# Patient Record
Sex: Female | Born: 1950
Health system: Southern US, Community
[De-identification: ages and names within clinical notes are randomized; demographics above are authoritative.]

## PROBLEM LIST (undated history)

## (undated) DIAGNOSIS — T8859XA Other complications of anesthesia, initial encounter: Secondary | ICD-10-CM

## (undated) DIAGNOSIS — Z9889 Other specified postprocedural states: Secondary | ICD-10-CM

## (undated) DIAGNOSIS — T4145XA Adverse effect of unspecified anesthetic, initial encounter: Secondary | ICD-10-CM

## (undated) DIAGNOSIS — R112 Nausea with vomiting, unspecified: Secondary | ICD-10-CM

## (undated) HISTORY — PX: BREAST CYST ASPIRATION: SHX578

---

## 1998-01-04 ENCOUNTER — Other Ambulatory Visit: Admission: RE | Admit: 1998-01-04 | Discharge: 1998-01-04 | Payer: Self-pay | Admitting: Obstetrics & Gynecology

## 1998-10-26 ENCOUNTER — Other Ambulatory Visit: Admission: RE | Admit: 1998-10-26 | Discharge: 1998-10-26 | Payer: Self-pay | Admitting: Obstetrics & Gynecology

## 1999-01-05 ENCOUNTER — Other Ambulatory Visit: Admission: RE | Admit: 1999-01-05 | Discharge: 1999-01-05 | Payer: Self-pay | Admitting: Obstetrics & Gynecology

## 1999-11-09 ENCOUNTER — Encounter (INDEPENDENT_AMBULATORY_CARE_PROVIDER_SITE_OTHER): Payer: Self-pay | Admitting: Specialist

## 1999-11-09 ENCOUNTER — Inpatient Hospital Stay (HOSPITAL_COMMUNITY): Admission: RE | Admit: 1999-11-09 | Discharge: 1999-11-12 | Payer: Self-pay | Admitting: Obstetrics & Gynecology

## 2001-02-28 ENCOUNTER — Other Ambulatory Visit: Admission: RE | Admit: 2001-02-28 | Discharge: 2001-02-28 | Payer: Self-pay | Admitting: Obstetrics & Gynecology

## 2002-09-04 ENCOUNTER — Other Ambulatory Visit: Admission: RE | Admit: 2002-09-04 | Discharge: 2002-09-04 | Payer: Self-pay | Admitting: Obstetrics & Gynecology

## 2004-03-03 ENCOUNTER — Other Ambulatory Visit: Admission: RE | Admit: 2004-03-03 | Discharge: 2004-03-03 | Payer: Self-pay | Admitting: Obstetrics & Gynecology

## 2005-02-05 ENCOUNTER — Encounter: Admission: RE | Admit: 2005-02-05 | Discharge: 2005-02-05 | Payer: Self-pay | Admitting: Obstetrics & Gynecology

## 2005-04-04 ENCOUNTER — Other Ambulatory Visit: Admission: RE | Admit: 2005-04-04 | Discharge: 2005-04-04 | Payer: Self-pay | Admitting: Obstetrics & Gynecology

## 2006-03-22 ENCOUNTER — Encounter: Admission: RE | Admit: 2006-03-22 | Discharge: 2006-03-22 | Payer: Self-pay | Admitting: Obstetrics & Gynecology

## 2007-03-25 ENCOUNTER — Encounter: Admission: RE | Admit: 2007-03-25 | Discharge: 2007-03-25 | Payer: Self-pay | Admitting: Obstetrics & Gynecology

## 2008-04-21 ENCOUNTER — Encounter: Admission: RE | Admit: 2008-04-21 | Discharge: 2008-04-21 | Payer: Self-pay | Admitting: Obstetrics & Gynecology

## 2009-05-06 ENCOUNTER — Encounter: Admission: RE | Admit: 2009-05-06 | Discharge: 2009-05-06 | Payer: Self-pay | Admitting: Obstetrics & Gynecology

## 2010-05-08 ENCOUNTER — Encounter: Admission: RE | Admit: 2010-05-08 | Discharge: 2010-05-08 | Payer: Self-pay | Admitting: Internal Medicine

## 2011-03-28 ENCOUNTER — Other Ambulatory Visit: Payer: Self-pay | Admitting: Obstetrics & Gynecology

## 2011-03-28 DIAGNOSIS — Z1231 Encounter for screening mammogram for malignant neoplasm of breast: Secondary | ICD-10-CM

## 2011-05-16 ENCOUNTER — Ambulatory Visit
Admission: RE | Admit: 2011-05-16 | Discharge: 2011-05-16 | Disposition: A | Discharge status: Home / Self Care | Payer: BC Managed Care – PPO | Source: Ambulatory Visit | Attending: Obstetrics & Gynecology | Admitting: Obstetrics & Gynecology

## 2011-05-16 DIAGNOSIS — Z1231 Encounter for screening mammogram for malignant neoplasm of breast: Secondary | ICD-10-CM

## 2012-04-21 ENCOUNTER — Other Ambulatory Visit: Payer: Self-pay | Admitting: Obstetrics & Gynecology

## 2012-04-21 DIAGNOSIS — Z1231 Encounter for screening mammogram for malignant neoplasm of breast: Secondary | ICD-10-CM

## 2012-05-28 ENCOUNTER — Ambulatory Visit
Admission: RE | Admit: 2012-05-28 | Discharge: 2012-05-28 | Disposition: A | Discharge status: Home / Self Care | Payer: BC Managed Care – PPO | Source: Ambulatory Visit | Attending: Obstetrics & Gynecology | Admitting: Obstetrics & Gynecology

## 2012-05-28 DIAGNOSIS — Z1231 Encounter for screening mammogram for malignant neoplasm of breast: Secondary | ICD-10-CM

## 2013-05-11 ENCOUNTER — Other Ambulatory Visit: Payer: Self-pay

## 2013-05-11 DIAGNOSIS — Z1231 Encounter for screening mammogram for malignant neoplasm of breast: Secondary | ICD-10-CM

## 2013-06-05 ENCOUNTER — Ambulatory Visit
Admission: RE | Admit: 2013-06-05 | Discharge: 2013-06-05 | Disposition: A | Discharge status: Home / Self Care | Payer: BC Managed Care – PPO | Source: Ambulatory Visit

## 2013-06-05 DIAGNOSIS — Z1231 Encounter for screening mammogram for malignant neoplasm of breast: Secondary | ICD-10-CM

## 2014-05-17 ENCOUNTER — Other Ambulatory Visit: Payer: Self-pay

## 2014-05-17 DIAGNOSIS — Z1231 Encounter for screening mammogram for malignant neoplasm of breast: Secondary | ICD-10-CM

## 2014-06-07 ENCOUNTER — Encounter (INDEPENDENT_AMBULATORY_CARE_PROVIDER_SITE_OTHER): Payer: Self-pay

## 2014-06-07 ENCOUNTER — Ambulatory Visit
Admission: RE | Admit: 2014-06-07 | Discharge: 2014-06-07 | Disposition: A | Discharge status: Home / Self Care | Payer: BC Managed Care – PPO | Source: Ambulatory Visit

## 2014-06-07 DIAGNOSIS — Z1231 Encounter for screening mammogram for malignant neoplasm of breast: Secondary | ICD-10-CM

## 2015-08-17 ENCOUNTER — Other Ambulatory Visit: Payer: Self-pay

## 2015-08-17 DIAGNOSIS — Z1231 Encounter for screening mammogram for malignant neoplasm of breast: Secondary | ICD-10-CM

## 2015-09-19 ENCOUNTER — Ambulatory Visit
Admission: RE | Admit: 2015-09-19 | Discharge: 2015-09-19 | Disposition: A | Discharge status: Home / Self Care | Payer: 59 | Source: Ambulatory Visit

## 2015-09-19 DIAGNOSIS — Z1231 Encounter for screening mammogram for malignant neoplasm of breast: Secondary | ICD-10-CM

## 2016-01-12 ENCOUNTER — Ambulatory Visit (INDEPENDENT_AMBULATORY_CARE_PROVIDER_SITE_OTHER): Payer: BLUE CROSS/BLUE SHIELD | Admitting: Diagnostic Neuroimaging

## 2016-01-12 ENCOUNTER — Encounter (INDEPENDENT_AMBULATORY_CARE_PROVIDER_SITE_OTHER): Payer: Self-pay | Admitting: Diagnostic Neuroimaging

## 2016-01-12 DIAGNOSIS — Z0289 Encounter for other administrative examinations: Secondary | ICD-10-CM

## 2016-01-12 DIAGNOSIS — M25561 Pain in right knee: Secondary | ICD-10-CM

## 2016-01-12 DIAGNOSIS — R200 Anesthesia of skin: Secondary | ICD-10-CM

## 2016-01-12 DIAGNOSIS — M25562 Pain in left knee: Secondary | ICD-10-CM

## 2016-01-12 DIAGNOSIS — R208 Other disturbances of skin sensation: Secondary | ICD-10-CM | POA: Diagnosis not present

## 2016-01-12 NOTE — Procedures (Signed)
   [facility]  NCS (NERVE CONDUCTION STUDY) WITH EMG (ELECTROMYOGRAPHY) REPORT   STUDY DATE: 01/12/16 PATIENT NAME: Jacqueline Harper DOB: Dec 26, 1950 MRN: [id number]  ORDERING CLINICIAN: Deirdre Pippins  TECHNOLOGIST: Laretta Alstrom  ELECTROMYOGRAPHER: Earlean Polka. Penumalli, MD  CLINICAL INFORMATION: 65 year old female with bilateral foot and leg pain. Symptoms worsening over the past one year.  FINDINGS: NERVE CONDUCTION STUDY: Bilateral peroneal and tibial motor responses and F wave latencies are normal. Bilateral H reflex responses are normal. Bilateral peroneal sensory responses are normal.   NEEDLE ELECTROMYOGRAPHY: Needle examination of right lower extremity vastus medialis, tibialis anterior, gastrocnemius is normal. Bilateral L5-S1 paraspinal muscles demonstrate 2+ positive sharp waves and fibrillation potentials.   IMPRESSION:  Mildly abnormal study demonstrating: 1. Abnormal spontaneous activity in bilateral lower lumbar paraspinal muscles may reflect lumbar nerve root irritation. 2. Remainder study is unremarkable. No widespread large fiber neuropathy.     INTERPRETING PHYSICIAN:  Penni Bombard, MD Certified in Neurology, Neurophysiology and Neuroimaging  [facility] [address], [place], [place] [address] [phone]

## 2016-01-16 ENCOUNTER — Other Ambulatory Visit: Payer: Self-pay | Admitting: Internal Medicine

## 2016-01-16 DIAGNOSIS — M5106 Intervertebral disc disorders with myelopathy, lumbar region: Secondary | ICD-10-CM

## 2016-01-21 ENCOUNTER — Ambulatory Visit
Admission: RE | Admit: 2016-01-21 | Discharge: 2016-01-21 | Disposition: A | Discharge status: Home / Self Care | Payer: BLUE CROSS/BLUE SHIELD | Source: Ambulatory Visit | Attending: Internal Medicine | Admitting: Internal Medicine

## 2016-01-21 DIAGNOSIS — M5106 Intervertebral disc disorders with myelopathy, lumbar region: Secondary | ICD-10-CM

## 2016-08-16 ENCOUNTER — Other Ambulatory Visit: Payer: Self-pay | Admitting: Internal Medicine

## 2016-08-16 DIAGNOSIS — Z1231 Encounter for screening mammogram for malignant neoplasm of breast: Secondary | ICD-10-CM

## 2016-09-19 ENCOUNTER — Ambulatory Visit: Payer: BLUE CROSS/BLUE SHIELD

## 2016-09-27 ENCOUNTER — Ambulatory Visit: Payer: BLUE CROSS/BLUE SHIELD

## 2016-10-24 ENCOUNTER — Ambulatory Visit
Admission: RE | Admit: 2016-10-24 | Discharge: 2016-10-24 | Disposition: A | Discharge status: Home / Self Care | Payer: Medicare Other | Source: Ambulatory Visit | Attending: Internal Medicine | Admitting: Internal Medicine

## 2016-10-24 DIAGNOSIS — Z1231 Encounter for screening mammogram for malignant neoplasm of breast: Secondary | ICD-10-CM

## 2017-02-08 DIAGNOSIS — H25813 Combined forms of age-related cataract, bilateral: Secondary | ICD-10-CM | POA: Diagnosis not present

## 2017-02-08 DIAGNOSIS — H52203 Unspecified astigmatism, bilateral: Secondary | ICD-10-CM | POA: Diagnosis not present

## 2017-02-08 DIAGNOSIS — H1851 Endothelial corneal dystrophy: Secondary | ICD-10-CM | POA: Diagnosis not present

## 2017-02-08 DIAGNOSIS — H353121 Nonexudative age-related macular degeneration, left eye, early dry stage: Secondary | ICD-10-CM | POA: Diagnosis not present

## 2017-04-03 DIAGNOSIS — D2272 Melanocytic nevi of left lower limb, including hip: Secondary | ICD-10-CM | POA: Diagnosis not present

## 2017-04-03 DIAGNOSIS — D2262 Melanocytic nevi of left upper limb, including shoulder: Secondary | ICD-10-CM | POA: Diagnosis not present

## 2017-04-03 DIAGNOSIS — D1801 Hemangioma of skin and subcutaneous tissue: Secondary | ICD-10-CM | POA: Diagnosis not present

## 2017-04-03 DIAGNOSIS — L814 Other melanin hyperpigmentation: Secondary | ICD-10-CM | POA: Diagnosis not present

## 2017-04-03 DIAGNOSIS — L821 Other seborrheic keratosis: Secondary | ICD-10-CM | POA: Diagnosis not present

## 2017-04-03 DIAGNOSIS — D2271 Melanocytic nevi of right lower limb, including hip: Secondary | ICD-10-CM | POA: Diagnosis not present

## 2017-04-03 DIAGNOSIS — D2261 Melanocytic nevi of right upper limb, including shoulder: Secondary | ICD-10-CM | POA: Diagnosis not present

## 2017-06-28 DIAGNOSIS — H35342 Macular cyst, hole, or pseudohole, left eye: Secondary | ICD-10-CM | POA: Diagnosis not present

## 2017-07-08 DIAGNOSIS — H43392 Other vitreous opacities, left eye: Secondary | ICD-10-CM | POA: Diagnosis not present

## 2017-07-08 DIAGNOSIS — H2511 Age-related nuclear cataract, right eye: Secondary | ICD-10-CM | POA: Diagnosis not present

## 2017-07-08 DIAGNOSIS — H35342 Macular cyst, hole, or pseudohole, left eye: Secondary | ICD-10-CM | POA: Diagnosis not present

## 2017-07-08 DIAGNOSIS — H2512 Age-related nuclear cataract, left eye: Secondary | ICD-10-CM | POA: Diagnosis not present

## 2017-07-27 DIAGNOSIS — Z23 Encounter for immunization: Secondary | ICD-10-CM | POA: Diagnosis not present

## 2017-08-29 DIAGNOSIS — R82998 Other abnormal findings in urine: Secondary | ICD-10-CM | POA: Diagnosis not present

## 2017-08-29 DIAGNOSIS — E7849 Other hyperlipidemia: Secondary | ICD-10-CM | POA: Diagnosis not present

## 2017-08-29 DIAGNOSIS — I10 Essential (primary) hypertension: Secondary | ICD-10-CM | POA: Diagnosis not present

## 2017-09-02 DIAGNOSIS — H1851 Endothelial corneal dystrophy: Secondary | ICD-10-CM | POA: Diagnosis not present

## 2017-09-02 DIAGNOSIS — H43812 Vitreous degeneration, left eye: Secondary | ICD-10-CM | POA: Diagnosis not present

## 2017-09-02 DIAGNOSIS — H25812 Combined forms of age-related cataract, left eye: Secondary | ICD-10-CM | POA: Diagnosis not present

## 2017-09-02 DIAGNOSIS — H35372 Puckering of macula, left eye: Secondary | ICD-10-CM | POA: Diagnosis not present

## 2017-09-11 DIAGNOSIS — M5106 Intervertebral disc disorders with myelopathy, lumbar region: Secondary | ICD-10-CM | POA: Diagnosis not present

## 2017-09-11 DIAGNOSIS — E7849 Other hyperlipidemia: Secondary | ICD-10-CM | POA: Diagnosis not present

## 2017-09-11 DIAGNOSIS — E669 Obesity, unspecified: Secondary | ICD-10-CM | POA: Diagnosis not present

## 2017-09-11 DIAGNOSIS — I10 Essential (primary) hypertension: Secondary | ICD-10-CM | POA: Diagnosis not present

## 2017-09-11 DIAGNOSIS — Z6831 Body mass index (BMI) 31.0-31.9, adult: Secondary | ICD-10-CM | POA: Diagnosis not present

## 2017-09-11 DIAGNOSIS — M1990 Unspecified osteoarthritis, unspecified site: Secondary | ICD-10-CM | POA: Diagnosis not present

## 2017-09-11 DIAGNOSIS — J309 Allergic rhinitis, unspecified: Secondary | ICD-10-CM | POA: Diagnosis not present

## 2017-09-11 DIAGNOSIS — Z0000 Encounter for general adult medical examination without abnormal findings: Secondary | ICD-10-CM | POA: Diagnosis not present

## 2017-09-11 DIAGNOSIS — G629 Polyneuropathy, unspecified: Secondary | ICD-10-CM | POA: Diagnosis not present

## 2017-09-27 DIAGNOSIS — Z1212 Encounter for screening for malignant neoplasm of rectum: Secondary | ICD-10-CM | POA: Diagnosis not present

## 2017-10-23 ENCOUNTER — Other Ambulatory Visit: Payer: Self-pay | Admitting: Internal Medicine

## 2017-10-23 DIAGNOSIS — Z139 Encounter for screening, unspecified: Secondary | ICD-10-CM

## 2017-12-04 ENCOUNTER — Ambulatory Visit
Admission: RE | Admit: 2017-12-04 | Discharge: 2017-12-04 | Disposition: A | Discharge status: Home / Self Care | Payer: Medicare Other | Source: Ambulatory Visit | Attending: Internal Medicine | Admitting: Internal Medicine

## 2017-12-04 DIAGNOSIS — Z139 Encounter for screening, unspecified: Secondary | ICD-10-CM

## 2017-12-04 DIAGNOSIS — Z1231 Encounter for screening mammogram for malignant neoplasm of breast: Secondary | ICD-10-CM | POA: Diagnosis not present

## 2018-02-14 DIAGNOSIS — H52203 Unspecified astigmatism, bilateral: Secondary | ICD-10-CM | POA: Diagnosis not present

## 2018-02-14 DIAGNOSIS — H353121 Nonexudative age-related macular degeneration, left eye, early dry stage: Secondary | ICD-10-CM | POA: Diagnosis not present

## 2018-02-14 DIAGNOSIS — H25813 Combined forms of age-related cataract, bilateral: Secondary | ICD-10-CM | POA: Diagnosis not present

## 2018-02-14 DIAGNOSIS — H43813 Vitreous degeneration, bilateral: Secondary | ICD-10-CM | POA: Diagnosis not present

## 2018-03-24 DIAGNOSIS — Z13820 Encounter for screening for osteoporosis: Secondary | ICD-10-CM | POA: Diagnosis not present

## 2018-05-23 DIAGNOSIS — D2261 Melanocytic nevi of right upper limb, including shoulder: Secondary | ICD-10-CM | POA: Diagnosis not present

## 2018-05-23 DIAGNOSIS — D1801 Hemangioma of skin and subcutaneous tissue: Secondary | ICD-10-CM | POA: Diagnosis not present

## 2018-05-23 DIAGNOSIS — D2271 Melanocytic nevi of right lower limb, including hip: Secondary | ICD-10-CM | POA: Diagnosis not present

## 2018-05-23 DIAGNOSIS — D2272 Melanocytic nevi of left lower limb, including hip: Secondary | ICD-10-CM | POA: Diagnosis not present

## 2018-05-23 DIAGNOSIS — L820 Inflamed seborrheic keratosis: Secondary | ICD-10-CM | POA: Diagnosis not present

## 2018-05-23 DIAGNOSIS — L814 Other melanin hyperpigmentation: Secondary | ICD-10-CM | POA: Diagnosis not present

## 2018-05-23 DIAGNOSIS — D225 Melanocytic nevi of trunk: Secondary | ICD-10-CM | POA: Diagnosis not present

## 2018-05-23 DIAGNOSIS — L821 Other seborrheic keratosis: Secondary | ICD-10-CM | POA: Diagnosis not present

## 2018-06-30 DIAGNOSIS — L720 Epidermal cyst: Secondary | ICD-10-CM | POA: Diagnosis not present

## 2018-06-30 DIAGNOSIS — L739 Follicular disorder, unspecified: Secondary | ICD-10-CM | POA: Diagnosis not present

## 2018-06-30 DIAGNOSIS — Z6830 Body mass index (BMI) 30.0-30.9, adult: Secondary | ICD-10-CM | POA: Diagnosis not present

## 2018-07-28 ENCOUNTER — Other Ambulatory Visit: Payer: Self-pay | Admitting: Gastroenterology

## 2018-08-04 DIAGNOSIS — Z23 Encounter for immunization: Secondary | ICD-10-CM | POA: Diagnosis not present

## 2018-08-22 ENCOUNTER — Other Ambulatory Visit: Payer: Self-pay | Admitting: Gastroenterology

## 2018-08-27 DIAGNOSIS — D120 Benign neoplasm of cecum: Secondary | ICD-10-CM | POA: Diagnosis not present

## 2018-08-27 DIAGNOSIS — Z86010 Personal history of colonic polyps: Secondary | ICD-10-CM | POA: Diagnosis not present

## 2018-08-27 DIAGNOSIS — D125 Benign neoplasm of sigmoid colon: Secondary | ICD-10-CM | POA: Diagnosis not present

## 2018-08-28 ENCOUNTER — Other Ambulatory Visit: Payer: Self-pay

## 2018-08-28 ENCOUNTER — Ambulatory Visit (HOSPITAL_COMMUNITY): Payer: Medicare Other | Admitting: Anesthesiology

## 2018-08-28 ENCOUNTER — Encounter (HOSPITAL_COMMUNITY): Payer: Self-pay | Admitting: Emergency Medicine

## 2018-08-28 ENCOUNTER — Ambulatory Visit (HOSPITAL_COMMUNITY)
Admission: RE | Admit: 2018-08-28 | Discharge: 2018-08-28 | Disposition: A | Discharge status: Home / Self Care | Payer: Medicare Other | Source: Ambulatory Visit | Attending: Gastroenterology | Admitting: Gastroenterology

## 2018-08-28 ENCOUNTER — Encounter (HOSPITAL_COMMUNITY)
Admission: RE | Disposition: A | Discharge status: Home / Self Care | Payer: Self-pay | Source: Ambulatory Visit | Attending: Gastroenterology

## 2018-08-28 DIAGNOSIS — D120 Benign neoplasm of cecum: Secondary | ICD-10-CM | POA: Diagnosis not present

## 2018-08-28 DIAGNOSIS — D125 Benign neoplasm of sigmoid colon: Secondary | ICD-10-CM | POA: Diagnosis not present

## 2018-08-28 DIAGNOSIS — I10 Essential (primary) hypertension: Secondary | ICD-10-CM | POA: Insufficient documentation

## 2018-08-28 DIAGNOSIS — Z8719 Personal history of other diseases of the digestive system: Secondary | ICD-10-CM | POA: Diagnosis not present

## 2018-08-28 DIAGNOSIS — Z1211 Encounter for screening for malignant neoplasm of colon: Secondary | ICD-10-CM | POA: Insufficient documentation

## 2018-08-28 DIAGNOSIS — K5730 Diverticulosis of large intestine without perforation or abscess without bleeding: Secondary | ICD-10-CM | POA: Diagnosis not present

## 2018-08-28 HISTORY — DX: Nausea with vomiting, unspecified: Z98.890

## 2018-08-28 HISTORY — DX: Adverse effect of unspecified anesthetic, initial encounter: T41.45XA

## 2018-08-28 HISTORY — PX: COLONOSCOPY WITH PROPOFOL: SHX5780

## 2018-08-28 HISTORY — PX: POLYPECTOMY: SHX5525

## 2018-08-28 HISTORY — PX: BIOPSY: SHX5522

## 2018-08-28 HISTORY — DX: Nausea with vomiting, unspecified: R11.2

## 2018-08-28 HISTORY — DX: Other complications of anesthesia, initial encounter: T88.59XA

## 2018-08-28 SURGERY — COLONOSCOPY WITH PROPOFOL
Anesthesia: Monitor Anesthesia Care

## 2018-08-28 MED ORDER — LIDOCAINE 2% (20 MG/ML) 5 ML SYRINGE
INTRAMUSCULAR | Status: DC | PRN
  Administered 2018-08-28: 100 mg via INTRAVENOUS

## 2018-08-28 MED ORDER — PROPOFOL 500 MG/50ML IV EMUL
INTRAVENOUS | Status: DC | PRN
  Administered 2018-08-28: 175 ug/kg/min via INTRAVENOUS

## 2018-08-28 MED ORDER — PROPOFOL 10 MG/ML IV BOLUS
INTRAVENOUS | Status: DC | PRN
  Administered 2018-08-28 (×2): 20 mg via INTRAVENOUS

## 2018-08-28 MED ORDER — SODIUM CHLORIDE 0.9 % IV SOLN: INTRAVENOUS | Status: DC

## 2018-08-28 MED ORDER — PROPOFOL 10 MG/ML IV BOLUS
INTRAVENOUS | Status: AC
  Filled 2018-08-28: qty 40

## 2018-08-28 MED ORDER — LACTATED RINGERS IV SOLN
INTRAVENOUS | Status: DC
  Administered 2018-08-28: 08:00:00 via INTRAVENOUS

## 2018-08-28 SURGICAL SUPPLY — 22 items

## 2018-08-28 NOTE — Anesthesia Preprocedure Evaluation (Addendum)
Anesthesia Evaluation  Patient identified by MRN, date of birth, ID band Patient awake    Reviewed: Allergy & Precautions, NPO status , Patient's Chart, lab work & pertinent test results  History of Anesthesia Complications (+) PONV  Airway Mallampati: II  TM Distance: >3 FB Neck ROM: Full    Dental no notable dental hx. (+) Teeth Intact, Dental Advisory Given   Pulmonary neg pulmonary ROS,    Pulmonary exam normal breath sounds clear to auscultation       Cardiovascular hypertension, Normal cardiovascular exam Rhythm:Regular Rate:Normal     Neuro/Psych negative neurological ROS  negative psych ROS   GI/Hepatic negative GI ROS, Neg liver ROS,   Endo/Other  negative endocrine ROS  Renal/GU negative Renal ROS  negative genitourinary   Musculoskeletal negative musculoskeletal ROS (+)   Abdominal   Peds  Hematology negative hematology ROS (+)   Anesthesia Other Findings H/o colon polyp  Reproductive/Obstetrics                            Anesthesia Physical Anesthesia Plan  ASA: II  Anesthesia Plan: MAC   Post-op Pain Management:    Induction:   PONV Risk Score and Plan: 2 and Treatment may vary due to age or medical condition  Airway Management Planned: Natural Airway  Additional Equipment:   Intra-op Plan:   Post-operative Plan:   Informed Consent: I have reviewed the patients History and Physical, chart, labs and discussed the procedure including the risks, benefits and alternatives for the proposed anesthesia with the patient or authorized representative who has indicated his/her understanding and acceptance.   Dental advisory given  Plan Discussed with: CRNA  Anesthesia Plan Comments:         Anesthesia Quick Evaluation

## 2018-08-28 NOTE — Progress Notes (Signed)
Jacqueline Harper 8:23 AM  Subjective: She without any GI complaints and no problems since we saw her last in the office  Objective: Vital signs stable afebrile no acute distress exam please see previous assessment evaluation  Assessment: History of colon polyps due for repeat screening  Plan: Okay to proceed with colonoscopy with anesthesia assistance  [facility]  Pager [phone] After 5PM or if no answer call [phone]

## 2018-08-28 NOTE — Op Note (Signed)
[facility] Patient Name: Jacqueline Harper Procedure Date: 08/28/2018 MRN: [id number] Attending MD: Clarene Essex , MD Date of Birth: 04/20/1951 CSN: [id number] Age: 67 Admit Type: Outpatient Procedure:                Colonoscopy Indications:              High risk colon cancer surveillance: Personal                            history of colonic polyps, Last colonoscopy: August                            2016, Personal history of colonic polyps Providers:                Clarene Essex, MD, Cleda Daub, RN, Charolette Child,                            Technician, [facility], CRNA Referring MD:              Medicines:                Propofol total dose 400 mg IV, 100 mg IV lidocaine Complications:            No immediate complications. Estimated Blood Loss:     Estimated blood loss: none. Procedure:                Pre-Anesthesia Assessment:                           - Prior to the procedure, a History and Physical                            was performed, and patient medications and                            allergies were reviewed. The patient's tolerance of                            previous anesthesia was also reviewed. The risks                            and benefits of the procedure and the sedation                            options and risks were discussed with the patient.                            All questions were answered, and informed consent                            was obtained. Prior Anticoagulants: The patient has                            taken no previous anticoagulant or antiplatelet  agents. ASA Grade Assessment: II - A patient with                            mild systemic disease. After reviewing the risks                            and benefits, the patient was deemed in                            satisfactory condition to undergo the procedure.                           After obtaining informed consent, the  colonoscope                            was passed under direct vision. Throughout the                            procedure, the patient's blood pressure, pulse, and                            oxygen saturations were monitored continuously. The                            [id number]-[id number] ([id number] Olympus peds colonoscope was                            introduced through the anus and advanced to the the                            cecum, identified by appendiceal orifice and                            ileocecal valve. The ileocecal valve, appendiceal                            orifice, and rectum were photographed. The                            colonoscopy was performed without difficulty. The                            patient tolerated the procedure well. The quality                            of the bowel preparation was adequate. Scope In: 9:35:25 AM Scope Out: 9:57:22 AM Scope Withdrawal Time: 0 hours 17 minutes 18 seconds  Total Procedure Duration: 0 hours 21 minutes 57 seconds  Findings:      Internal hemorrhoids were found during retroflexion, during perianal       exam and during digital exam. The hemorrhoids were small.      Scattered small-mouthed diverticula were found in the sigmoid colon.      A small polyp was found in the mid sigmoid colon. The polyp  was       semi-sessile. Biopsies were taken with a cold forceps for histology.      A small polyp was found in the cecum. The polyp was semi-sessile. The       polyp was removed with a cold snare. Resection and retrieval were       complete.      The exam was otherwise without abnormality. Impression:               - Internal hemorrhoids.                           - Diverticulosis in the sigmoid colon.                           - One small polyp in the mid sigmoid colon.                            Biopsied.                           - One small polyp in the cecum, removed with a cold                            snare. Resected and  retrieved.                           - The examination was otherwise normal. Moderate Sedation:      Not Applicable - Patient had care per Anesthesia. Recommendation:           - Patient has a contact number available for                            emergencies. The signs and symptoms of potential                            delayed complications were discussed with the                            patient. Return to normal activities tomorrow.                            Written discharge instructions were provided to the                            patient.                           - Soft diet today.                           - Continue present medications.                           - Await pathology results.                           - Repeat colonoscopy in  5 years for surveillance                            based on pathology results.                           - Return to GI office PRN.                           - Telephone GI clinic for pathology results in 1                            week.                           - Telephone GI clinic if symptomatic PRN. Procedure Code(s):        --- Professional ---                           [phone], Colonoscopy, flexible; with removal of                            tumor(s), polyp(s), or other lesion(s) by snare                            technique                           45380, 14, Colonoscopy, flexible; with biopsy,                            single or multiple Diagnosis Code(s):        --- Professional ---                           Z86.010, Personal history of colonic polyps                           D12.5, Benign neoplasm of sigmoid colon                           D12.0, Benign neoplasm of cecum                           K57.30, Diverticulosis of large intestine without                            perforation or abscess without bleeding CPT copyright 2018 American Medical Association. All rights reserved. The codes documented in this report  are preliminary and upon coder review may  be revised to meet current compliance requirements. Clarene Essex, MD 08/28/2018 10:19:38 AM This report has been signed electronically. Number of Addenda: 0

## 2018-08-28 NOTE — Anesthesia Postprocedure Evaluation (Signed)
Anesthesia Post Note  Patient: Jacqueline Harper  Procedure(s) Performed: COLONOSCOPY WITH PROPOFOL (N/A ) HOT HEMOSTASIS (ARGON PLASMA COAGULATION/BICAP) (N/A ) POLYPECTOMY BIOPSY     Patient location during evaluation: Endoscopy Anesthesia Type: MAC Level of consciousness: awake and alert Pain management: pain level controlled Vital Signs Assessment: post-procedure vital signs reviewed and stable Respiratory status: spontaneous breathing, nonlabored ventilation, respiratory function stable and patient connected to nasal cannula oxygen Cardiovascular status: blood pressure returned to baseline and stable Postop Assessment: no apparent nausea or vomiting Anesthetic complications: no    Last Vitals:  Vitals:   08/28/18 1010 08/28/18 1020  BP: (!) 148/68 (!) 159/93  Pulse: (!) 58 (!) 56  Resp: 13 11  Temp:    SpO2: 100% 100%    Last Pain:  Vitals:   08/28/18 1020  TempSrc:   PainSc: 0-No pain                 Merriel Zinger L Randee Huston

## 2018-08-28 NOTE — Discharge Instructions (Signed)
Colonoscopy, Adult, Care After This sheet gives you information about how to care for yourself after your procedure. Your doctor may also give you more specific instructions. If you have problems or questions, call your doctor. Follow these instructions at home: General instructions   For the first 24 hours after the procedure: ? Do not drive or use machinery. ? Do not sign important documents. ? Do not drink alcohol. ? Do your daily activities more slowly than normal. ? Eat foods that are soft and easy to digest. ? Rest often.  Take over-the-counter or prescription medicines only as told by your doctor.  It is up to you to get the results of your procedure. Ask your doctor, or the department performing the procedure, when your results will be ready. To help cramping and bloating:  Try walking around.  Put heat on your belly (abdomen) as told by your doctor. Use a heat source that your doctor recommends, such as a moist heat pack or a heating pad. ? Put a towel between your skin and the heat source. ? Leave the heat on for 20-30 minutes. ? Remove the heat if your skin turns bright red. This is especially important if you cannot feel pain, heat, or cold. You can get burned. Eating and drinking  Drink enough fluid to keep your pee (urine) clear or pale yellow.  Return to your normal diet as told by your doctor. Avoid heavy or fried foods that are hard to digest.  Avoid drinking alcohol for as long as told by your doctor. Contact a doctor if:  You have blood in your poop (stool) 2-3 days after the procedure. Get help right away if:  You have more than a small amount of blood in your poop.  You see large clumps of tissue (blood clots) in your poop.  Your belly is swollen.  You feel sick to your stomach (nauseous).  You throw up (vomit).  You have a fever.  You have belly pain that gets worse, and medicine does not help your pain. This information is not intended to  replace advice given to you by your health care provider. Make sure you discuss any questions you have with your health care provider. Document Released: 11/03/2010 Document Revised: 06/25/2016 Document Reviewed: 06/25/2016 Elsevier Interactive Patient Education  2017 [employer]. Call if question or problem otherwise call for biopsy report in 1 week and follow-up as needed and probable repeat colonoscopy in our office in 5 years

## 2018-08-28 NOTE — Transfer of Care (Signed)
Immediate Anesthesia Transfer of Care Note  Patient: Jacqueline Harper  Procedure(s) Performed: COLONOSCOPY WITH PROPOFOL (N/A ) HOT HEMOSTASIS (ARGON PLASMA COAGULATION/BICAP) (N/A ) POLYPECTOMY BIOPSY  Patient Location: PACU  Anesthesia Type:MAC  Level of Consciousness: awake, alert  and oriented  Airway & Oxygen Therapy: Patient Spontanous Breathing and Patient connected to face mask oxygen  Post-op Assessment: Report given to RN and Post -op Vital signs reviewed and stable  Post vital signs: Reviewed and stable  Last Vitals:  Vitals Value Taken Time  BP    Temp    Pulse 57 08/28/2018 10:05 AM  Resp 13 08/28/2018 10:05 AM  SpO2 100 % 08/28/2018 10:05 AM  Vitals shown include unvalidated device data.  Last Pain:  Vitals:   08/28/18 0815  TempSrc: Oral  PainSc: 0-No pain         Complications: No apparent anesthesia complications

## 2018-08-29 ENCOUNTER — Encounter (HOSPITAL_COMMUNITY): Payer: Self-pay | Admitting: Gastroenterology

## 2018-09-08 DIAGNOSIS — E7849 Other hyperlipidemia: Secondary | ICD-10-CM | POA: Diagnosis not present

## 2018-09-08 DIAGNOSIS — I10 Essential (primary) hypertension: Secondary | ICD-10-CM | POA: Diagnosis not present

## 2018-09-17 DIAGNOSIS — Z0000 Encounter for general adult medical examination without abnormal findings: Secondary | ICD-10-CM | POA: Diagnosis not present

## 2018-09-17 DIAGNOSIS — E7849 Other hyperlipidemia: Secondary | ICD-10-CM | POA: Diagnosis not present

## 2018-09-17 DIAGNOSIS — I10 Essential (primary) hypertension: Secondary | ICD-10-CM | POA: Diagnosis not present

## 2018-09-17 DIAGNOSIS — M1990 Unspecified osteoarthritis, unspecified site: Secondary | ICD-10-CM | POA: Diagnosis not present

## 2018-09-17 DIAGNOSIS — Z6830 Body mass index (BMI) 30.0-30.9, adult: Secondary | ICD-10-CM | POA: Diagnosis not present

## 2018-11-24 DIAGNOSIS — M1990 Unspecified osteoarthritis, unspecified site: Secondary | ICD-10-CM | POA: Diagnosis not present

## 2018-11-24 DIAGNOSIS — I10 Essential (primary) hypertension: Secondary | ICD-10-CM | POA: Diagnosis not present

## 2018-11-24 DIAGNOSIS — E7849 Other hyperlipidemia: Secondary | ICD-10-CM | POA: Diagnosis not present

## 2018-11-28 ENCOUNTER — Other Ambulatory Visit: Payer: Self-pay | Admitting: Internal Medicine

## 2018-11-28 DIAGNOSIS — Z1231 Encounter for screening mammogram for malignant neoplasm of breast: Secondary | ICD-10-CM

## 2018-12-08 DIAGNOSIS — D171 Benign lipomatous neoplasm of skin and subcutaneous tissue of trunk: Secondary | ICD-10-CM | POA: Diagnosis not present

## 2018-12-08 DIAGNOSIS — L821 Other seborrheic keratosis: Secondary | ICD-10-CM | POA: Diagnosis not present

## 2018-12-08 DIAGNOSIS — L720 Epidermal cyst: Secondary | ICD-10-CM | POA: Diagnosis not present

## 2018-12-08 DIAGNOSIS — D2271 Melanocytic nevi of right lower limb, including hip: Secondary | ICD-10-CM | POA: Diagnosis not present

## 2018-12-08 DIAGNOSIS — L820 Inflamed seborrheic keratosis: Secondary | ICD-10-CM | POA: Diagnosis not present

## 2018-12-15 ENCOUNTER — Other Ambulatory Visit: Payer: Self-pay

## 2018-12-15 ENCOUNTER — Encounter (HOSPITAL_BASED_OUTPATIENT_CLINIC_OR_DEPARTMENT_OTHER): Payer: Self-pay | Admitting: Emergency Medicine

## 2018-12-15 ENCOUNTER — Emergency Department (HOSPITAL_BASED_OUTPATIENT_CLINIC_OR_DEPARTMENT_OTHER)
Admission: EM | Admit: 2018-12-15 | Discharge: 2018-12-15 | Disposition: A | Discharge status: Home / Self Care | Payer: No Typology Code available for payment source | Attending: Emergency Medicine | Admitting: Emergency Medicine

## 2018-12-15 DIAGNOSIS — Z79899 Other long term (current) drug therapy: Secondary | ICD-10-CM | POA: Diagnosis not present

## 2018-12-15 DIAGNOSIS — R52 Pain, unspecified: Secondary | ICD-10-CM | POA: Insufficient documentation

## 2018-12-15 DIAGNOSIS — Z041 Encounter for examination and observation following transport accident: Secondary | ICD-10-CM | POA: Diagnosis not present

## 2018-12-15 DIAGNOSIS — M791 Myalgia, unspecified site: Secondary | ICD-10-CM | POA: Diagnosis not present
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
## 2018-12-15 NOTE — Discharge Instructions (Addendum)
Ibuprofen 600 mg every 6 hours as needed for pain.  Return to the ER if you develop worsening pain, difficulty breathing, headaches, or other new and concerning symptoms.

## 2018-12-26 ENCOUNTER — Other Ambulatory Visit: Payer: Self-pay

## 2018-12-26 ENCOUNTER — Ambulatory Visit
Admission: RE | Admit: 2018-12-26 | Discharge: 2018-12-26 | Disposition: A | Discharge status: Home / Self Care | Payer: Medicare Other | Source: Ambulatory Visit | Attending: Internal Medicine | Admitting: Internal Medicine

## 2018-12-26 DIAGNOSIS — Z1231 Encounter for screening mammogram for malignant neoplasm of breast: Secondary | ICD-10-CM

## 2019-02-19 DIAGNOSIS — H52203 Unspecified astigmatism, bilateral: Secondary | ICD-10-CM | POA: Diagnosis not present

## 2019-02-19 DIAGNOSIS — H5310 Unspecified subjective visual disturbances: Secondary | ICD-10-CM | POA: Diagnosis not present

## 2019-02-19 DIAGNOSIS — H1851 Endothelial corneal dystrophy: Secondary | ICD-10-CM | POA: Diagnosis not present

## 2019-02-19 DIAGNOSIS — H353122 Nonexudative age-related macular degeneration, left eye, intermediate dry stage: Secondary | ICD-10-CM | POA: Diagnosis not present

## 2019-03-16 DIAGNOSIS — L720 Epidermal cyst: Secondary | ICD-10-CM | POA: Diagnosis not present

## 2019-03-16 DIAGNOSIS — G629 Polyneuropathy, unspecified: Secondary | ICD-10-CM | POA: Diagnosis not present

## 2019-03-16 DIAGNOSIS — M5106 Intervertebral disc disorders with myelopathy, lumbar region: Secondary | ICD-10-CM | POA: Diagnosis not present

## 2019-03-16 DIAGNOSIS — Z1331 Encounter for screening for depression: Secondary | ICD-10-CM | POA: Diagnosis not present

## 2019-03-16 DIAGNOSIS — E785 Hyperlipidemia, unspecified: Secondary | ICD-10-CM | POA: Diagnosis not present

## 2019-03-16 DIAGNOSIS — M1990 Unspecified osteoarthritis, unspecified site: Secondary | ICD-10-CM | POA: Diagnosis not present

## 2019-03-16 DIAGNOSIS — I10 Essential (primary) hypertension: Secondary | ICD-10-CM | POA: Diagnosis not present

## 2019-03-16 DIAGNOSIS — J309 Allergic rhinitis, unspecified: Secondary | ICD-10-CM | POA: Diagnosis not present

## 2019-03-16 DIAGNOSIS — E669 Obesity, unspecified: Secondary | ICD-10-CM | POA: Diagnosis not present

## 2019-06-30 DIAGNOSIS — L821 Other seborrheic keratosis: Secondary | ICD-10-CM | POA: Diagnosis not present

## 2019-06-30 DIAGNOSIS — D2272 Melanocytic nevi of left lower limb, including hip: Secondary | ICD-10-CM | POA: Diagnosis not present

## 2019-06-30 DIAGNOSIS — D2271 Melanocytic nevi of right lower limb, including hip: Secondary | ICD-10-CM | POA: Diagnosis not present

## 2019-06-30 DIAGNOSIS — D2262 Melanocytic nevi of left upper limb, including shoulder: Secondary | ICD-10-CM | POA: Diagnosis not present

## 2019-06-30 DIAGNOSIS — L820 Inflamed seborrheic keratosis: Secondary | ICD-10-CM | POA: Diagnosis not present

## 2019-06-30 DIAGNOSIS — D485 Neoplasm of uncertain behavior of skin: Secondary | ICD-10-CM | POA: Diagnosis not present

## 2019-06-30 DIAGNOSIS — D2261 Melanocytic nevi of right upper limb, including shoulder: Secondary | ICD-10-CM | POA: Diagnosis not present

## 2019-06-30 DIAGNOSIS — D225 Melanocytic nevi of trunk: Secondary | ICD-10-CM | POA: Diagnosis not present

## 2019-06-30 DIAGNOSIS — L814 Other melanin hyperpigmentation: Secondary | ICD-10-CM | POA: Diagnosis not present

## 2019-06-30 DIAGNOSIS — D1801 Hemangioma of skin and subcutaneous tissue: Secondary | ICD-10-CM | POA: Diagnosis not present

## 2019-06-30 DIAGNOSIS — D171 Benign lipomatous neoplasm of skin and subcutaneous tissue of trunk: Secondary | ICD-10-CM | POA: Diagnosis not present

## 2019-07-14 DIAGNOSIS — Z23 Encounter for immunization: Secondary | ICD-10-CM | POA: Diagnosis not present

## 2019-07-24 DIAGNOSIS — L988 Other specified disorders of the skin and subcutaneous tissue: Secondary | ICD-10-CM | POA: Diagnosis not present

## 2019-07-24 DIAGNOSIS — D485 Neoplasm of uncertain behavior of skin: Secondary | ICD-10-CM | POA: Diagnosis not present

## 2019-09-16 DIAGNOSIS — E7849 Other hyperlipidemia: Secondary | ICD-10-CM | POA: Diagnosis not present

## 2019-09-18 DIAGNOSIS — R82998 Other abnormal findings in urine: Secondary | ICD-10-CM | POA: Diagnosis not present

## 2019-10-08 DIAGNOSIS — Z1212 Encounter for screening for malignant neoplasm of rectum: Secondary | ICD-10-CM | POA: Diagnosis not present

## 2019-11-10 ENCOUNTER — Ambulatory Visit: Payer: BLUE CROSS/BLUE SHIELD

## 2019-11-19 ENCOUNTER — Ambulatory Visit: Payer: Medicare Other | Attending: Internal Medicine

## 2019-11-19 DIAGNOSIS — Z23 Encounter for immunization: Secondary | ICD-10-CM | POA: Insufficient documentation

## 2019-11-19 NOTE — Progress Notes (Signed)
   Covid-19 Vaccination Clinic  Name:  Alexianna Celli    MRN: [id number] DOB: 19-Jun-1951  11/19/2019  Ms. Krishnaswamy was observed post Covid-19 immunization for 15 minutes without incidence. She was provided with Vaccine Information Sheet and instruction to access the V-Safe system.   Ms. Divita was instructed to call 911 with any severe reactions post vaccine: Marland Kitchen Difficulty breathing  . Swelling of your face and throat  . A fast heartbeat  . A bad rash all over your body  . Dizziness and weakness    Immunizations Administered    Name Date Dose VIS Date Route   Pfizer COVID-19 Vaccine 11/19/2019  4:59 PM 0.3 mL 09/25/2019 Intramuscular   Manufacturer: [place]   Lot: [id number]   [place]: [id number]

## 2019-12-01 ENCOUNTER — Ambulatory Visit: Payer: BLUE CROSS/BLUE SHIELD

## 2019-12-15 ENCOUNTER — Ambulatory Visit: Payer: Medicare Other | Attending: Internal Medicine

## 2019-12-15 DIAGNOSIS — Z23 Encounter for immunization: Secondary | ICD-10-CM

## 2019-12-15 NOTE — Progress Notes (Signed)
   Covid-19 Vaccination Clinic  Name:  Jacqueline Harper    MRN: [id number] DOB: 11-Feb-1951  12/15/2019  [name] was observed post Covid-19 immunization for 15 minutes without incident. She was provided with Vaccine Information Sheet and instruction to access the V-Safe system.   Ms. Danger was instructed to call 911 with any severe reactions post vaccine: Marland Kitchen Difficulty breathing  . Swelling of face and throat  . A fast heartbeat  . A bad rash all over body  . Dizziness and weakness   Immunizations Administered    Name Date Dose VIS Date Route   Pfizer COVID-19 Vaccine 12/15/2019 10:55 AM 0.3 mL 09/25/2019 Intramuscular   Manufacturer: [place]   Lot: [id number]   [place]: [id number]

## 2020-01-14 DIAGNOSIS — H52203 Unspecified astigmatism, bilateral: Secondary | ICD-10-CM | POA: Diagnosis not present

## 2020-01-14 DIAGNOSIS — H35372 Puckering of macula, left eye: Secondary | ICD-10-CM | POA: Diagnosis not present

## 2020-01-14 DIAGNOSIS — H353121 Nonexudative age-related macular degeneration, left eye, early dry stage: Secondary | ICD-10-CM | POA: Diagnosis not present

## 2020-01-14 DIAGNOSIS — H18513 Endothelial corneal dystrophy, bilateral: Secondary | ICD-10-CM | POA: Diagnosis not present

## 2020-01-22 ENCOUNTER — Other Ambulatory Visit: Payer: Self-pay | Admitting: Internal Medicine

## 2020-01-22 DIAGNOSIS — Z1231 Encounter for screening mammogram for malignant neoplasm of breast: Secondary | ICD-10-CM

## 2020-02-04 ENCOUNTER — Other Ambulatory Visit: Payer: Self-pay

## 2020-02-04 ENCOUNTER — Ambulatory Visit
Admission: RE | Admit: 2020-02-04 | Discharge: 2020-02-04 | Disposition: A | Discharge status: Home / Self Care | Payer: Medicare Other | Source: Ambulatory Visit | Attending: Internal Medicine | Admitting: Internal Medicine

## 2020-02-04 DIAGNOSIS — Z1231 Encounter for screening mammogram for malignant neoplasm of breast: Secondary | ICD-10-CM

## 2020-02-26 ENCOUNTER — Emergency Department (HOSPITAL_BASED_OUTPATIENT_CLINIC_OR_DEPARTMENT_OTHER)
Admission: EM | Admit: 2020-02-26 | Discharge: 2020-02-26 | Disposition: A | Discharge status: Home / Self Care | Payer: Medicare Other | Attending: Emergency Medicine | Admitting: Emergency Medicine

## 2020-02-26 ENCOUNTER — Encounter (HOSPITAL_BASED_OUTPATIENT_CLINIC_OR_DEPARTMENT_OTHER): Payer: Self-pay | Admitting: *Deleted

## 2020-02-26 ENCOUNTER — Other Ambulatory Visit: Payer: Self-pay

## 2020-02-26 ENCOUNTER — Emergency Department (HOSPITAL_BASED_OUTPATIENT_CLINIC_OR_DEPARTMENT_OTHER): Payer: Medicare Other

## 2020-02-26 DIAGNOSIS — Y92002 Bathroom of unspecified non-institutional (private) residence single-family (private) house as the place of occurrence of the external cause: Secondary | ICD-10-CM | POA: Diagnosis not present

## 2020-02-26 DIAGNOSIS — S6992XA Unspecified injury of left wrist, hand and finger(s), initial encounter: Secondary | ICD-10-CM | POA: Diagnosis present

## 2020-02-26 DIAGNOSIS — W19XXXA Unspecified fall, initial encounter: Secondary | ICD-10-CM

## 2020-02-26 DIAGNOSIS — Y93E1 Activity, personal bathing and showering: Secondary | ICD-10-CM | POA: Insufficient documentation

## 2020-02-26 DIAGNOSIS — W0110XA Fall on same level from slipping, tripping and stumbling with subsequent striking against unspecified object, initial encounter: Secondary | ICD-10-CM | POA: Insufficient documentation

## 2020-02-26 DIAGNOSIS — S52572A Other intraarticular fracture of lower end of left radius, initial encounter for closed fracture: Secondary | ICD-10-CM | POA: Diagnosis not present

## 2020-02-26 DIAGNOSIS — Y999 Unspecified external cause status: Secondary | ICD-10-CM | POA: Diagnosis not present

## 2020-02-26 MED ORDER — HYDROCODONE-ACETAMINOPHEN 5-325 MG PO TABS: 1.0000 | ORAL_TABLET | ORAL | 0 refills | Status: DC | PRN

## 2020-02-26 NOTE — ED Triage Notes (Signed)
She slipped in the shower and fell. Injury to her left wrist. Ice pack on arrival.

## 2020-02-26 NOTE — Discharge Instructions (Signed)
Follow up with Orthopedics  Return for new or worsening symptoms 

## 2020-02-26 NOTE — ED Provider Notes (Signed)
[facility] Provider Note   CSN: [id number] Arrival date & time: 02/26/20  1258     History Chief Complaint  Patient presents with  . Fall  . Wrist Injury    Jacqueline Harper is a 69 y.o. female with this a past medical history who presents for evaluation of left wrist pain.  Was stepping out of shower when she slipped and fell.  Fell on outstretched arm.  Denies hitting her head, LOC anticoagulation.  No headache, lightheadedness, dizziness, chest pain, shortness of breath, abdominal pain, back pain or pelvic pain.  Ambulatory since the incident.  No preceding symptoms prior to fall.  Normal sensation however decreased range of motion secondary to pain.  She has not take anything for pain.  Rates her pain an 8/10.  Does not anything here currently.  Denies additional aggravating or relieving factors.  History obtained from patient and past medical records.  No interpreter is used.  HPI     Past Medical History:  Diagnosis Date  . Complication of anesthesia   . PONV (postoperative nausea and vomiting)     There are no problems to display for this patient.   Past Surgical History:  Procedure Laterality Date  . BIOPSY  08/28/2018   Procedure: BIOPSY;  Surgeon: Clarene Essex, MD;  Location: [facility];  Service: Endoscopy;;  . BREAST CYST ASPIRATION Right   . COLONOSCOPY WITH PROPOFOL N/A 08/28/2018   Procedure: COLONOSCOPY WITH PROPOFOL;  Surgeon: Clarene Essex, MD;  Location: [facility];  Service: Endoscopy;  Laterality: N/A;  . POLYPECTOMY  08/28/2018   Procedure: POLYPECTOMY;  Surgeon: Clarene Essex, MD;  Location: [facility];  Service: Endoscopy;;     OB History   No obstetric history on file.     Family History  Problem Relation Age of Onset  . Breast cancer Paternal Aunt        late 82's early 28's  . Breast cancer Paternal Aunt        late 43's early 51's    Social History   Tobacco Use  . Smoking status: Never Smoker  .  Smokeless tobacco: Never Used  Substance Use Topics  . Alcohol use: Yes  . Drug use: Never    Home Medications Prior to Admission medications   Medication Sig Start Date End Date Taking? Authorizing Provider  estrogens-methylTEST (ESTRATEST) 1.25-2.5 MG tablet Take 1 tablet by mouth daily.    [provider]  HYDROcodone-acetaminophen (NORCO/VICODIN) 5-325 MG tablet Take 1 tablet by mouth every 4 (four) hours as needed. 02/26/20   Ancil Dewan A, PA-C  losartan-hydrochlorothiazide (HYZAAR) 100-25 MG tablet Take 1 tablet by mouth daily.    [provider]  meloxicam (MOBIC) 7.5 MG tablet Take 7.5 mg by mouth 2 (two) times daily.    [provider]  Multiple Vitamins-Minerals (PRESERVISION AREDS 2) CAPS Take 1 capsule by mouth 2 (two) times daily.    [provider]  mupirocin ointment (BACTROBAN) 2 % Place 1 application into the nose daily as needed (wound care).    [provider]  Omega-3 Fatty Acids (FISH OIL) 1000 MG CAPS Take 1,000 mg by mouth daily.    [provider]  rosuvastatin (CRESTOR) 10 MG tablet Take 10 mg by mouth 4 (four) times a week. At bedtime    [provider]  TURMERIC PO Take 1 capsule by mouth 2 (two) times daily.    [provider]    Allergies    Patient  has no known allergies.  Review of Systems   Review of Systems  Constitutional: Negative.   HENT: Negative.   Respiratory: Negative.   Cardiovascular: Negative.   Gastrointestinal: Negative.   Genitourinary: Negative.   Musculoskeletal: Negative for back pain, gait problem, neck pain and neck stiffness.       Left wrist pain  Skin: Negative.   Neurological: Negative.   All other systems reviewed and are negative.  Physical Exam Updated Vital Signs BP (!) 161/89   Pulse 68   Temp 98.2 F (36.8 C) (Oral)   Resp 18   Ht 5\' 7"  (1.702 m)   Wt 87.1 kg   SpO2 99%   BMI 30.07 kg/m   Physical Exam Vitals and nursing note  reviewed.  Constitutional:      General: She is not in acute distress.    Appearance: She is well-developed. She is not ill-appearing, toxic-appearing or diaphoretic.  HENT:     Head: Normocephalic and atraumatic.     Nose: Nose normal.     Mouth/Throat:     Mouth: Mucous membranes are moist.     Pharynx: Oropharynx is clear.  Eyes:     Pupils: Pupils are equal, round, and reactive to light.  Cardiovascular:     Rate and Rhythm: Normal rate.     Pulses: Normal pulses.     Heart sounds: Normal heart sounds.  Pulmonary:     Effort: Pulmonary effort is normal. No respiratory distress.     Breath sounds: Normal breath sounds.  Abdominal:     General: Bowel sounds are normal. There is no distension.  Musculoskeletal:        General: Normal range of motion.     Right shoulder: Normal.     Left shoulder: Normal.     Right upper arm: Normal.     Left upper arm: Normal.     Right elbow: Normal.     Left elbow: Normal.     Right forearm: Normal.     Left forearm: Normal.     Right wrist: Normal.     Left wrist: Swelling, tenderness and bony tenderness present. No deformity, effusion, lacerations, snuff box tenderness or crepitus. Normal range of motion. Normal pulse.     Right hand: Normal.     Left hand: Normal.       Hands:     Cervical back: Normal and normal range of motion.     Thoracic back: Normal.     Lumbar back: Normal.     Comments: Tenderness palpation to left radial aspect of wrist.  Able to flex and extend however with pain.  There is some mild swelling.  No erythema or warmth.  No contusions or abrasions.  Equal handgrip bilaterally.  No tenderness to midshaft, proximal radius or ulna.  Full range of motion to olecranon and humerus.  No midline tenderness to back.  Skin:    General: Skin is warm and dry.     Capillary Refill: Capillary refill takes less than 2 seconds.     Comments: No contusions or abrasions.  No erythema or warmth.  Soft tissue swelling to left  distal radius  Neurological:     General: No focal deficit present.     Mental Status: She is alert and oriented to person, place, and time.     Sensory: Sensation is intact.     Motor: Motor function is intact.     Coordination: Coordination is intact.  Gait: Gait is intact.     Comments: Intact sensation to radius and ulna.  5/5 grip strength.    ED Results / Procedures / Treatments   Labs (all labs ordered are listed, but only abnormal results are displayed) Labs Reviewed - No data to display  EKG None  Radiology DG Wrist Complete Left  Result Date: 02/26/2020 CLINICAL DATA:  Fall EXAM: LEFT WRIST - COMPLETE 3+ VIEW COMPARISON:  None. FINDINGS: There is a distal left radial fracture with intra-articular extension. Slight displacement of fracture fragments and posterior angulation of distal fragments. No subluxation or dislocation. IMPRESSION: Comminuted intra-articular distal left radial fracture with slight displacement and posterior angulation. Electronically Signed   By: Rolm Baptise M.D.   On: 02/26/2020 13:40    Procedures .Ortho Injury Treatment  Date/Time: 02/26/2020 2:47 PM Performed by: Nettie Elm, PA-C Authorized by: Nettie Elm, PA-C   Consent:    Consent obtained:  Verbal   Consent given by:  Patient   Risks discussed:  Fracture, nerve damage, restricted joint movement, vascular damage, stiffness, recurrent dislocation and irreducible dislocation   Alternatives discussed:  No treatment, alternative treatment, immobilization, referral and delayed treatmentInjury location: wrist Location details: left wrist Injury type: fracture Fracture type: distal radius Pre-procedure neurovascular assessment: neurovascularly intact Pre-procedure distal perfusion: normal Pre-procedure neurological function: normal Pre-procedure range of motion: normal  Anesthesia: Local anesthesia used: no  Patient sedated: NoManipulation performed: no Immobilization:  splint and sling Splint type: sugar tong Supplies used: aluminum splint,  cotton padding,  elastic bandage and Ortho-Glass Post-procedure neurovascular assessment: post-procedure neurovascularly intact Post-procedure distal perfusion: normal Post-procedure neurological function: normal Post-procedure range of motion: normal Patient tolerance: patient tolerated the procedure well with no immediate complications    (including critical care time)  Medications Ordered in ED Medications - No data to display  ED Course  I have reviewed the triage vital signs and the nursing notes.  Pertinent labs & imaging results that were available during my care of the patient were reviewed by me and considered in my medical decision making (see chart for details).  55 old presents for evaluation mechanical fall.  Reoccurred just PTA.  Denies hitting head, LOC or anticoagulation.  Ambulatory since the incident.  Slipped and fell coming out of shower landing on an outstretched left hand.  He is neurovascularly intact.  She is able to perform range of motion to left upper extremity however has pain with flexion and extension to left wrist over radial aspect.  Equal handgrip bilaterally.  No tenderness to midshaft, proximal radius or ulna.  Full range of motion to rest of extremities.  No midline tenderness to spinal canal.  No preceding symptoms prior to fall.    Plain film x-ray personally reviewed and interpreted Imaging shows comminuted intra-articular distal radius fracture.  There is slight displacement.  Discussed imaging with patient.  RICE for symptomatic management.  Placed in sugar tong splint and provided sling.  She has been seen by [facility] orthopedics previously.  Will refer outpatient to Dr. Apolonio Schneiders.  Not think she needs emergent surgical intervention at this time however will have close follow-up with orthopedics outpatient.  NV intact after splint placement.  The patient has been  appropriately medically screened and/or stabilized in the ED. I have low suspicion for any other emergent medical condition which would require further screening, evaluation or treatment in the ED or require inpatient management.  Patient is hemodynamically stable and in no acute distress.  Patient able to  ambulate in department prior to ED.  Evaluation does not show acute pathology that would require ongoing or additional emergent interventions while in the emergency department or further inpatient treatment.  I have discussed the diagnosis with the patient and answered all questions.  Pain is been managed while in the emergency department and patient has no further complaints prior to discharge.  Patient is comfortable with plan discussed in room and is stable for discharge at this time.  I have discussed strict return precautions for returning to the emergency department.  Patient was encouraged to follow-up with PCP/specialist refer to at discharge.    MDM Rules/Calculators/A&P                      Final Clinical Impression(s) / ED Diagnoses Final diagnoses:  Fall, initial encounter  Other closed intra-articular fracture of distal end of left radius, initial encounter    Rx / DC Orders ED Discharge Orders         Ordered    HYDROcodone-acetaminophen (NORCO/VICODIN) 5-325 MG tablet  Every 4 hours PRN     02/26/20 1432           Jaymi Tinner A, PA-C 02/26/20 1448    Dorie Rank, MD 02/29/20 [phone]

## 2020-02-29 DIAGNOSIS — S52572A Other intraarticular fracture of lower end of left radius, initial encounter for closed fracture: Secondary | ICD-10-CM | POA: Diagnosis not present

## 2020-03-02 DIAGNOSIS — X58XXXA Exposure to other specified factors, initial encounter: Secondary | ICD-10-CM | POA: Diagnosis not present

## 2020-03-02 DIAGNOSIS — G8918 Other acute postprocedural pain: Secondary | ICD-10-CM | POA: Diagnosis not present

## 2020-03-02 DIAGNOSIS — S52572A Other intraarticular fracture of lower end of left radius, initial encounter for closed fracture: Secondary | ICD-10-CM | POA: Diagnosis not present

## 2020-03-02 DIAGNOSIS — Y999 Unspecified external cause status: Secondary | ICD-10-CM | POA: Diagnosis not present

## 2020-03-15 DIAGNOSIS — Z4789 Encounter for other orthopedic aftercare: Secondary | ICD-10-CM | POA: Diagnosis not present

## 2020-03-15 DIAGNOSIS — S52532D Colles' fracture of left radius, subsequent encounter for closed fracture with routine healing: Secondary | ICD-10-CM | POA: Diagnosis not present

## 2020-03-23 DIAGNOSIS — R55 Syncope and collapse: Secondary | ICD-10-CM | POA: Diagnosis not present

## 2020-03-23 DIAGNOSIS — E669 Obesity, unspecified: Secondary | ICD-10-CM | POA: Diagnosis not present

## 2020-03-23 DIAGNOSIS — M1990 Unspecified osteoarthritis, unspecified site: Secondary | ICD-10-CM | POA: Diagnosis not present

## 2020-03-23 DIAGNOSIS — I10 Essential (primary) hypertension: Secondary | ICD-10-CM | POA: Diagnosis not present

## 2020-03-23 DIAGNOSIS — E785 Hyperlipidemia, unspecified: Secondary | ICD-10-CM | POA: Diagnosis not present

## 2020-03-29 DIAGNOSIS — M25632 Stiffness of left wrist, not elsewhere classified: Secondary | ICD-10-CM | POA: Diagnosis not present

## 2020-03-29 DIAGNOSIS — S52532D Colles' fracture of left radius, subsequent encounter for closed fracture with routine healing: Secondary | ICD-10-CM | POA: Diagnosis not present

## 2020-03-29 DIAGNOSIS — Z4789 Encounter for other orthopedic aftercare: Secondary | ICD-10-CM | POA: Diagnosis not present

## 2020-04-01 ENCOUNTER — Other Ambulatory Visit (HOSPITAL_COMMUNITY): Payer: Self-pay | Admitting: Internal Medicine

## 2020-04-01 ENCOUNTER — Ambulatory Visit (HOSPITAL_COMMUNITY)
Admission: RE | Admit: 2020-04-01 | Discharge: 2020-04-01 | Disposition: A | Discharge status: Home / Self Care | Payer: Medicare Other | Source: Ambulatory Visit | Attending: Vascular Surgery | Admitting: Vascular Surgery

## 2020-04-01 ENCOUNTER — Other Ambulatory Visit: Payer: Self-pay

## 2020-04-01 DIAGNOSIS — R55 Syncope and collapse: Secondary | ICD-10-CM

## 2020-04-05 ENCOUNTER — Ambulatory Visit (INDEPENDENT_AMBULATORY_CARE_PROVIDER_SITE_OTHER): Payer: Medicare Other | Admitting: Cardiovascular Disease

## 2020-04-05 ENCOUNTER — Other Ambulatory Visit: Payer: Self-pay

## 2020-04-05 ENCOUNTER — Encounter: Payer: Self-pay | Admitting: Cardiovascular Disease

## 2020-04-05 DIAGNOSIS — E785 Hyperlipidemia, unspecified: Secondary | ICD-10-CM | POA: Insufficient documentation

## 2020-04-05 DIAGNOSIS — E782 Mixed hyperlipidemia: Secondary | ICD-10-CM

## 2020-04-05 DIAGNOSIS — I10 Essential (primary) hypertension: Secondary | ICD-10-CM | POA: Insufficient documentation

## 2020-04-05 DIAGNOSIS — R0789 Other chest pain: Secondary | ICD-10-CM | POA: Diagnosis not present

## 2020-04-05 NOTE — Assessment & Plan Note (Signed)
History of hyperlipidemia on low-dose Crestor with lipid profile performed 09/16/2019 revealing total cholesterol 209, LDL of 133 and HDL 46.  She does admit to dietary discretion and says she "loves ice cream".

## 2020-04-05 NOTE — Patient Instructions (Addendum)
Medication Instructions:  Your physician recommends that you continue on your current medications as directed. Please refer to the Current Medication list given to you today.  Testing/Procedures: Your physician has requested that you have an echocardiogram. Echocardiography is a painless test that uses sound waves to create images of your heart. It provides your doctor with information about the size and shape of your heart and how well your heart's chambers and valves are working. This procedure takes approximately one hour. There are no restrictions for this procedure. This will be done at our [address] location:  [address]  CT coronary calcium score. This test is done at [address]. This is $150 out of pocket.   Coronary CalciumScan A coronary calcium scan is an imaging test used to look for deposits of calcium and other fatty materials (plaques) in the inner lining of the blood vessels of the heart (coronary arteries). These deposits of calcium and plaques can partly clog and narrow the coronary arteries without producing any symptoms or warning signs. This puts a person at risk for a heart attack. This test can detect these deposits before symptoms develop. Tell a health care provider about:  Any allergies you have.  All medicines you are taking, including vitamins, herbs, eye drops, creams, and over-the-counter medicines.  Any problems you or family members have had with anesthetic medicines.  Any blood disorders you have.  Any surgeries you have had.  Any medical conditions you have.  Whether you are pregnant or may be pregnant. What are the risks? Generally, this is a safe procedure. However, problems may occur, including:  Harm to a pregnant woman and her unborn baby. This test involves the use of radiation. Radiation exposure can be dangerous to a pregnant woman and her unborn baby. If you are pregnant, you generally should not  have this procedure done.  Slight increase in the risk of cancer. This is because of the radiation involved in the test. What happens before the procedure? No preparation is needed for this procedure. What happens during the procedure?  You will undress and remove any jewelry around your neck or chest.  You will put on a hospital gown.  Sticky electrodes will be placed on your chest. The electrodes will be connected to an electrocardiogram (ECG) machine to record a tracing of the electrical activity of your heart.  A CT scanner will take pictures of your heart. During this time, you will be asked to lie still and hold your breath for 2-3 seconds while a picture of your heart is being taken. The procedure may vary among health care providers and hospitals. What happens after the procedure?  You can get dressed.  You can return to your normal activities.  It is up to you to get the results of your test. Ask your health care provider, or the department that is doing the test, when your results will be ready. Summary  A coronary calcium scan is an imaging test used to look for deposits of calcium and other fatty materials (plaques) in the inner lining of the blood vessels of the heart (coronary arteries).  Generally, this is a safe procedure. Tell your health care provider if you are pregnant or may be pregnant.  No preparation is needed for this procedure.  A CT scanner will take pictures of your heart.  You can return to your normal activities after the scan is done. This information is not intended   to replace advice given to you by your health care provider. Make sure you discuss any questions you have with your health care provider. Document Released: 03/29/2008 Document Revised: 08/20/2016 Document Reviewed: 08/20/2016 Elsevier Interactive Patient Education  2017 [place]: At [facility], you and your health needs are our priority.  As part of our  continuing mission to provide you with exceptional heart care, we have created designated Provider Care Teams.  These Care Teams include your primary Cardiologist (physician) and Advanced Practice Providers (APPs -  Physician Assistants and Nurse Practitioners) who all work together to provide you with the care you need, when you need it.  We recommend signing up for the patient portal called "MyChart".  Sign up information is provided on this After Visit Summary.  MyChart is used to connect with patients for Virtual Visits (Telemedicine).  Patients are able to view lab/test results, encounter notes, upcoming appointments, etc.  Non-urgent messages can be sent to your provider as well.   To learn more about what you can do with MyChart, go to [email or web address].    Your next appointment:    AS NEEDED with Dr. Gwenlyn Found      Heart-Healthy Eating Plan Heart-healthy meal planning includes:  Eating less unhealthy fats.  Eating more healthy fats.  Making other changes in your diet. Talk with your doctor or a diet specialist (dietitian) to create an eating plan that is right for you.  Cooking Avoid frying your food. Try to bake, boil, grill, or broil it instead. You can also reduce fat by:  Removing the skin from poultry.  Removing all visible fats from meats.  Steaming vegetables in water or broth. Meal planning   At meals, divide your plate into four equal parts: ? Fill one-half of your plate with vegetables and green salads. ? Fill one-fourth of your plate with whole grains. ? Fill one-fourth of your plate with lean protein foods.  Eat 4-5 servings of vegetables per day. A serving of vegetables is: ? 1 cup of raw or cooked vegetables. ? 2 cups of raw leafy greens.  Eat 4-5 servings of fruit per day. A serving of fruit is: ? 1 medium whole fruit. ?  cup of dried fruit. ?  cup of fresh, frozen, or canned fruit. ?  cup of 100% fruit juice.  Eat more foods that have  soluble fiber. These are apples, broccoli, carrots, beans, peas, and barley. Try to get 20-30 g of fiber per day.  Eat 4-5 servings of nuts, legumes, and seeds per week: ? 1 serving of dried beans or legumes equals  cup after being cooked. ? 1 serving of nuts is  cup. ? 1 serving of seeds equals 1 tablespoon. General information  Eat more home-cooked food. Eat less restaurant, buffet, and fast food.  Limit or avoid alcohol.  Limit foods that are high in starch and sugar.  Avoid fried foods.  Lose weight if you are overweight.  Keep track of how much salt (sodium) you eat. This is important if you have high blood pressure. Ask your doctor to tell you more about this.  Try to add vegetarian meals each week. Fats  Choose healthy fats. These include olive oil and canola oil, flaxseeds, walnuts, almonds, and seeds.  Eat more omega-3 fats. These include salmon, mackerel, sardines, tuna, flaxseed oil, and ground flaxseeds. Try to eat fish at least 2 times each week.  Check food labels. Avoid foods with trans fats or high  amounts of saturated fat.  Limit saturated fats. ? These are often found in animal products, such as meats, butter, and cream. ? These are also found in plant foods, such as palm oil, palm kernel oil, and coconut oil.  Avoid foods with partially hydrogenated oils in them. These have trans fats. Examples are stick margarine, some tub margarines, cookies, crackers, and other baked goods. What foods can I eat? Fruits All fresh, canned (in natural juice), or frozen fruits. Vegetables Fresh or frozen vegetables (raw, steamed, roasted, or grilled). Green salads. Grains Most grains. Choose whole wheat and whole grains most of the time. Rice and pasta, including brown rice and pastas made with whole wheat. Meats and other proteins Lean, well-trimmed beef, veal, pork, and lamb. Chicken and [place] without skin. All fish and shellfish. Wild duck, rabbit, pheasant, and  venison. Egg whites or low-cholesterol egg substitutes. Dried beans, peas, lentils, and tofu. Seeds and most nuts. Dairy Low-fat or nonfat cheeses, including ricotta and mozzarella. Skim or 1% milk that is liquid, powdered, or evaporated. Buttermilk that is made with low-fat milk. Nonfat or low-fat yogurt. Fats and oils Non-hydrogenated (trans-free) margarines. Vegetable oils, including soybean, sesame, sunflower, olive, peanut, safflower, corn, canola, and cottonseed. Salad dressings or mayonnaise made with a vegetable oil. Beverages Mineral water. Coffee and tea. Diet carbonated beverages. Sweets and desserts Sherbet, gelatin, and fruit ice. Small amounts of dark chocolate. Limit all sweets and desserts. Seasonings and condiments All seasonings and condiments. The items listed above may not be a complete list of foods and drinks you can eat. Contact a dietitian for more options. What foods should I avoid? Fruits Canned fruit in heavy syrup. Fruit in cream or butter sauce. Fried fruit. Limit coconut. Vegetables Vegetables cooked in cheese, cream, or butter sauce. Fried vegetables. Grains Breads that are made with saturated or trans fats, oils, or whole milk. Croissants. Sweet rolls. Donuts. High-fat crackers, such as cheese crackers. Meats and other proteins Fatty meats, such as hot dogs, ribs, sausage, bacon, rib-eye roast or steak. High-fat deli meats, such as salami and bologna. Caviar. Domestic duck and goose. Organ meats, such as liver. Dairy Cream, sour cream, cream cheese, and creamed cottage cheese. Whole-milk cheeses. Whole or 2% milk that is liquid, evaporated, or condensed. Whole buttermilk. Cream sauce or high-fat cheese sauce. Yogurt that is made from whole milk. Fats and oils Meat fat, or shortening. Cocoa butter, hydrogenated oils, palm oil, coconut oil, palm kernel oil. Solid fats and shortenings, including bacon fat, salt pork, lard, and butter. Nondairy cream substitutes.  Salad dressings with cheese or sour cream. Beverages Regular sodas and juice drinks with added sugar. Sweets and desserts Frosting. Pudding. Cookies. Cakes. Pies. Milk chocolate or white chocolate. Buttered syrups. Full-fat ice cream or ice cream drinks. The items listed above may not be a complete list of foods and drinks to avoid. Contact a dietitian for more information. Summary  Heart-healthy meal planning includes eating less unhealthy fats, eating more healthy fats, and making other changes in your diet.  Eat a balanced diet. This includes fruits and vegetables, low-fat or nonfat dairy, lean protein, nuts and legumes, whole grains, and heart-healthy oils and fats. This information is not intended to replace advice given to you by your health care provider. Make sure you discuss any questions you have with your health care provider. Document Revised: 12/05/2017 Document Reviewed: 11/08/2017 Elsevier Patient Education  2020 [employer].

## 2020-04-05 NOTE — Assessment & Plan Note (Signed)
Patient has been experiencing some fullness in her chest that radiates to her back since January that occurs on a regular basis.  I am going to get a coronary calcium score to risk stratify.

## 2020-04-05 NOTE — Assessment & Plan Note (Signed)
History of essential hypertension with blood pressure measured today at 150/88.  She is on amlodipine, hydrochlorothiazide and losartan.

## 2020-04-05 NOTE — Progress Notes (Signed)
04/05/2020 Jacqueline Harper   09-23-1951  [id number]  Primary Physician Burnard Bunting, MD Primary Cardiologist: Lorretta Harp MD Lupe Carney, [place]  HPI:  Jacqueline Harper is a 69 y.o. 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  She was referred by Dr. Reynaldo Minium for cardiovascular valuation because of atypical chest pain.  Her risk factors include treated hypertension hyperlipidemia.  She does not smoke.  There is no family history for heart disease.  She does drink 2 bourbons a night or 3 glasses of wine plus or minus a [place]mopolitan .  She has never had a heart attack or stroke.  She has noticed some chest fullness/atypical chest pain since January rating to her back that occurs randomly.  She does walk 50 minutes a day and has no symptoms during activity.   Current Meds  Medication Sig  . amLODipine (NORVASC) 5 MG tablet Take 5 mg by mouth daily.  Marland Kitchen estrogens-methylTEST (ESTRATEST) 1.25-2.5 MG tablet Take 1 tablet by mouth daily.  . hydrochlorothiazide (HYDRODIURIL) 25 MG tablet Take 25 mg by mouth daily.  Marland Kitchen losartan (COZAAR) 100 MG tablet Take 100 mg by mouth daily.  . meloxicam (MOBIC) 7.5 MG tablet Take 7.5 mg by mouth 2 (two) times daily.  . Multiple Vitamins-Minerals (PRESERVISION AREDS 2) CAPS Take 1 capsule by mouth 2 (two) times daily.  . Omega-3 Fatty Acids (FISH OIL) 1000 MG CAPS Take 1,000 mg by mouth daily.  . rosuvastatin (CRESTOR) 10 MG tablet Take 10 mg by mouth 4 (four) times a week. At bedtime  . TURMERIC PO Take 1 capsule by mouth 2 (two) times daily.     No Known Allergies  Social History   Socioeconomic History  . Marital status: Married    Spouse name: Not on file  . Number of children: Not on file  . Years of education: Not on file  . Highest education level: Not on file  Occupational History  . Not on file  Tobacco Use  . Smoking  status: Never Smoker  . Smokeless tobacco: Never Used  Substance and Sexual Activity  . Alcohol use: Yes  . Drug use: Never  . Sexual activity: Not on file  Other Topics Concern  . Not on file  Social History Narrative  . Not on file   Social Determinants of Health   Financial Resource Strain:   . Difficulty of Paying Living Expenses:   Food Insecurity:   . Worried About Charity fundraiser in the Last Year:   . Arboriculturist in the Last Year:   Transportation Needs:   . Film/video editor (Medical):   Marland Kitchen Lack of Transportation (Non-Medical):   Physical Activity:   . Days of Exercise per Week:   . Minutes of Exercise per Session:   Stress:   . Feeling of Stress :   Social Connections:   . Frequency of Communication with Friends and Family:   . Frequency of Social Gatherings with Friends and Family:   . Attends Religious Services:   . Active Member of Clubs or Organizations:   . Attends Archivist Meetings:   Marland Kitchen Marital Status:   Intimate Partner Violence:   . Fear of Current or Ex-Partner:   . Emotionally Abused:   Marland Kitchen Physically Abused:   . Sexually Abused:      Review of Systems: General:  negative for chills, fever, night sweats or weight changes.  Cardiovascular: negative for chest pain, dyspnea on exertion, edema, orthopnea, palpitations, paroxysmal nocturnal dyspnea or shortness of breath Dermatological: negative for rash Respiratory: negative for cough or wheezing Urologic: negative for hematuria Abdominal: negative for nausea, vomiting, diarrhea, bright red blood per rectum, melena, or hematemesis Neurologic: negative for visual changes, syncope, or dizziness All other systems reviewed and are otherwise negative except as noted above.    Blood pressure (!) 150/88, pulse 63, height 5\' 7"  (1.702 m), weight 201 lb 3.2 oz (91.3 kg), SpO2 96 %.  General appearance: alert and no distress Neck: no adenopathy, no carotid bruit, no JVD, supple,  symmetrical, trachea midline and thyroid not enlarged, symmetric, no tenderness/mass/nodules Lungs: clear to auscultation bilaterally Heart: regular rate and rhythm, S1, S2 normal, no murmur, click, rub or gallop Extremities: extremities normal, atraumatic, no cyanosis or edema Pulses: 2+ and symmetric Skin: Skin color, texture, turgor normal. No rashes or lesions Neurologic: Alert and oriented X 3, normal strength and tone. Normal symmetric reflexes. Normal coordination and gait  EKG not performed today.  EKG performed 03/23/2020 revealed sinus rhythm without ST or T wave changes.  ASSESSMENT AND PLAN:   Essential hypertension History of essential hypertension with blood pressure measured today at 150/88.  She is on amlodipine, hydrochlorothiazide and losartan.  Hyperlipidemia History of hyperlipidemia on low-dose Crestor with lipid profile performed 09/16/2019 revealing total cholesterol 209, LDL of 133 and HDL 46.  She does admit to dietary discretion and says she "loves ice cream".  Atypical chest pain Patient has been experiencing some fullness in her chest that radiates to her back since January that occurs on a regular basis.  I am going to get a coronary calcium score to risk stratify.      Lorretta Harp MD FACP,FACC,FAHA, [facility] 04/05/2020 10:06 AM

## 2020-05-03 ENCOUNTER — Ambulatory Visit (INDEPENDENT_AMBULATORY_CARE_PROVIDER_SITE_OTHER)
Admission: RE | Admit: 2020-05-03 | Discharge: 2020-05-03 | Disposition: A | Discharge status: Home / Self Care | Payer: Self-pay | Source: Ambulatory Visit | Attending: Cardiovascular Disease | Admitting: Cardiovascular Disease

## 2020-05-03 ENCOUNTER — Other Ambulatory Visit: Payer: BLUE CROSS/BLUE SHIELD

## 2020-05-03 ENCOUNTER — Ambulatory Visit (HOSPITAL_COMMUNITY): Payer: Medicare Other | Attending: Cardiology

## 2020-05-03 ENCOUNTER — Other Ambulatory Visit: Payer: Self-pay

## 2020-05-03 DIAGNOSIS — R0789 Other chest pain: Secondary | ICD-10-CM | POA: Insufficient documentation

## 2020-05-03 DIAGNOSIS — E782 Mixed hyperlipidemia: Secondary | ICD-10-CM

## 2020-05-03 LAB — ECHOCARDIOGRAM COMPLETE
Area-P 1/2: 3.12 cm2
S' Lateral: 3 cm

## 2020-05-10 DIAGNOSIS — S52532D Colles' fracture of left radius, subsequent encounter for closed fracture with routine healing: Secondary | ICD-10-CM | POA: Diagnosis not present

## 2020-05-10 DIAGNOSIS — Z4789 Encounter for other orthopedic aftercare: Secondary | ICD-10-CM | POA: Diagnosis not present

## 2020-05-10 DIAGNOSIS — M25632 Stiffness of left wrist, not elsewhere classified: Secondary | ICD-10-CM | POA: Diagnosis not present

## 2020-05-16 DIAGNOSIS — M25632 Stiffness of left wrist, not elsewhere classified: Secondary | ICD-10-CM | POA: Diagnosis not present

## 2020-05-24 DIAGNOSIS — M25632 Stiffness of left wrist, not elsewhere classified: Secondary | ICD-10-CM | POA: Diagnosis not present

## 2020-05-25 DIAGNOSIS — M25561 Pain in right knee: Secondary | ICD-10-CM | POA: Diagnosis not present

## 2020-06-01 DIAGNOSIS — M25632 Stiffness of left wrist, not elsewhere classified: Secondary | ICD-10-CM | POA: Diagnosis not present

## 2020-06-09 DIAGNOSIS — S52532D Colles' fracture of left radius, subsequent encounter for closed fracture with routine healing: Secondary | ICD-10-CM | POA: Diagnosis not present

## 2020-06-09 DIAGNOSIS — M25632 Stiffness of left wrist, not elsewhere classified: Secondary | ICD-10-CM | POA: Diagnosis not present

## 2020-06-30 DIAGNOSIS — M25561 Pain in right knee: Secondary | ICD-10-CM | POA: Diagnosis not present

## 2020-06-30 DIAGNOSIS — M1711 Unilateral primary osteoarthritis, right knee: Secondary | ICD-10-CM | POA: Diagnosis not present

## 2020-07-22 DIAGNOSIS — Z23 Encounter for immunization: Secondary | ICD-10-CM | POA: Diagnosis not present

## 2020-08-03 DIAGNOSIS — L821 Other seborrheic keratosis: Secondary | ICD-10-CM | POA: Diagnosis not present

## 2020-08-03 DIAGNOSIS — D2262 Melanocytic nevi of left upper limb, including shoulder: Secondary | ICD-10-CM | POA: Diagnosis not present

## 2020-08-03 DIAGNOSIS — D1801 Hemangioma of skin and subcutaneous tissue: Secondary | ICD-10-CM | POA: Diagnosis not present

## 2020-08-03 DIAGNOSIS — D2261 Melanocytic nevi of right upper limb, including shoulder: Secondary | ICD-10-CM | POA: Diagnosis not present

## 2020-08-03 DIAGNOSIS — L814 Other melanin hyperpigmentation: Secondary | ICD-10-CM | POA: Diagnosis not present

## 2020-08-03 DIAGNOSIS — D225 Melanocytic nevi of trunk: Secondary | ICD-10-CM | POA: Diagnosis not present

## 2020-08-03 DIAGNOSIS — L820 Inflamed seborrheic keratosis: Secondary | ICD-10-CM | POA: Diagnosis not present

## 2020-08-03 DIAGNOSIS — D2272 Melanocytic nevi of left lower limb, including hip: Secondary | ICD-10-CM | POA: Diagnosis not present

## 2020-08-03 DIAGNOSIS — D2271 Melanocytic nevi of right lower limb, including hip: Secondary | ICD-10-CM | POA: Diagnosis not present

## 2020-08-03 DIAGNOSIS — L218 Other seborrheic dermatitis: Secondary | ICD-10-CM | POA: Diagnosis not present

## 2020-08-06 DIAGNOSIS — Z23 Encounter for immunization: Secondary | ICD-10-CM | POA: Diagnosis not present

## 2020-08-24 ENCOUNTER — Ambulatory Visit: Payer: BLUE CROSS/BLUE SHIELD

## 2020-09-01 DIAGNOSIS — M1711 Unilateral primary osteoarthritis, right knee: Secondary | ICD-10-CM | POA: Diagnosis not present

## 2020-09-01 DIAGNOSIS — M25561 Pain in right knee: Secondary | ICD-10-CM | POA: Diagnosis not present

## 2020-09-21 DIAGNOSIS — E785 Hyperlipidemia, unspecified: Secondary | ICD-10-CM | POA: Diagnosis not present

## 2020-09-28 DIAGNOSIS — E785 Hyperlipidemia, unspecified: Secondary | ICD-10-CM | POA: Diagnosis not present

## 2020-09-28 DIAGNOSIS — I10 Essential (primary) hypertension: Secondary | ICD-10-CM | POA: Diagnosis not present

## 2020-09-28 DIAGNOSIS — Z0000 Encounter for general adult medical examination without abnormal findings: Secondary | ICD-10-CM | POA: Diagnosis not present

## 2020-09-28 DIAGNOSIS — R82998 Other abnormal findings in urine: Secondary | ICD-10-CM | POA: Diagnosis not present

## 2020-09-28 DIAGNOSIS — M25561 Pain in right knee: Secondary | ICD-10-CM | POA: Diagnosis not present

## 2020-09-28 DIAGNOSIS — E669 Obesity, unspecified: Secondary | ICD-10-CM | POA: Diagnosis not present

## 2020-10-03 DIAGNOSIS — Z1212 Encounter for screening for malignant neoplasm of rectum: Secondary | ICD-10-CM | POA: Diagnosis not present

## 2020-10-27 DIAGNOSIS — M1711 Unilateral primary osteoarthritis, right knee: Secondary | ICD-10-CM | POA: Diagnosis not present

## 2020-11-03 DIAGNOSIS — M25561 Pain in right knee: Secondary | ICD-10-CM | POA: Diagnosis not present

## 2020-11-03 DIAGNOSIS — M1711 Unilateral primary osteoarthritis, right knee: Secondary | ICD-10-CM | POA: Diagnosis not present

## 2020-11-10 DIAGNOSIS — M25561 Pain in right knee: Secondary | ICD-10-CM | POA: Diagnosis not present

## 2020-11-10 DIAGNOSIS — M1711 Unilateral primary osteoarthritis, right knee: Secondary | ICD-10-CM | POA: Diagnosis not present

## 2020-12-21 ENCOUNTER — Other Ambulatory Visit: Payer: Self-pay | Admitting: Internal Medicine

## 2020-12-21 DIAGNOSIS — Z1231 Encounter for screening mammogram for malignant neoplasm of breast: Secondary | ICD-10-CM

## 2020-12-30 DIAGNOSIS — M1711 Unilateral primary osteoarthritis, right knee: Secondary | ICD-10-CM | POA: Diagnosis not present

## 2021-01-16 DIAGNOSIS — M25561 Pain in right knee: Secondary | ICD-10-CM | POA: Diagnosis not present

## 2021-01-18 DIAGNOSIS — M25561 Pain in right knee: Secondary | ICD-10-CM | POA: Diagnosis not present

## 2021-01-25 DIAGNOSIS — M25561 Pain in right knee: Secondary | ICD-10-CM | POA: Diagnosis not present

## 2021-01-27 DIAGNOSIS — M25561 Pain in right knee: Secondary | ICD-10-CM | POA: Diagnosis not present

## 2021-02-01 DIAGNOSIS — M25561 Pain in right knee: Secondary | ICD-10-CM | POA: Diagnosis not present

## 2021-02-02 DIAGNOSIS — M25561 Pain in right knee: Secondary | ICD-10-CM | POA: Diagnosis not present

## 2021-02-03 DIAGNOSIS — M25561 Pain in right knee: Secondary | ICD-10-CM | POA: Diagnosis not present

## 2021-02-07 DIAGNOSIS — M25561 Pain in right knee: Secondary | ICD-10-CM | POA: Diagnosis not present

## 2021-02-08 DIAGNOSIS — H35372 Puckering of macula, left eye: Secondary | ICD-10-CM | POA: Diagnosis not present

## 2021-02-08 DIAGNOSIS — H52203 Unspecified astigmatism, bilateral: Secondary | ICD-10-CM | POA: Diagnosis not present

## 2021-02-08 DIAGNOSIS — H26103 Unspecified traumatic cataract, bilateral: Secondary | ICD-10-CM | POA: Diagnosis not present

## 2021-02-09 DIAGNOSIS — M25561 Pain in right knee: Secondary | ICD-10-CM | POA: Diagnosis not present

## 2021-02-10 DIAGNOSIS — Z23 Encounter for immunization: Secondary | ICD-10-CM | POA: Diagnosis not present

## 2021-02-14 ENCOUNTER — Ambulatory Visit: Payer: BLUE CROSS/BLUE SHIELD

## 2021-02-14 DIAGNOSIS — M25561 Pain in right knee: Secondary | ICD-10-CM | POA: Diagnosis not present

## 2021-02-16 DIAGNOSIS — M25561 Pain in right knee: Secondary | ICD-10-CM | POA: Diagnosis not present

## 2021-02-21 DIAGNOSIS — M25561 Pain in right knee: Secondary | ICD-10-CM | POA: Diagnosis not present

## 2021-03-08 DIAGNOSIS — M25561 Pain in right knee: Secondary | ICD-10-CM | POA: Diagnosis not present

## 2021-03-17 DIAGNOSIS — M25561 Pain in right knee: Secondary | ICD-10-CM | POA: Diagnosis not present

## 2021-04-05 DIAGNOSIS — E663 Overweight: Secondary | ICD-10-CM | POA: Diagnosis not present

## 2021-04-05 DIAGNOSIS — E785 Hyperlipidemia, unspecified: Secondary | ICD-10-CM | POA: Diagnosis not present

## 2021-04-05 DIAGNOSIS — M5106 Intervertebral disc disorders with myelopathy, lumbar region: Secondary | ICD-10-CM | POA: Diagnosis not present

## 2021-04-05 DIAGNOSIS — I10 Essential (primary) hypertension: Secondary | ICD-10-CM | POA: Diagnosis not present

## 2021-04-12 DIAGNOSIS — Z5181 Encounter for therapeutic drug level monitoring: Secondary | ICD-10-CM | POA: Diagnosis not present

## 2021-04-25 ENCOUNTER — Ambulatory Visit
Admission: RE | Admit: 2021-04-25 | Discharge: 2021-04-25 | Disposition: A | Discharge status: Home / Self Care | Payer: Medicare Other | Source: Ambulatory Visit | Attending: Internal Medicine | Admitting: Internal Medicine

## 2021-04-25 ENCOUNTER — Other Ambulatory Visit: Payer: Self-pay

## 2021-04-25 DIAGNOSIS — Z1231 Encounter for screening mammogram for malignant neoplasm of breast: Secondary | ICD-10-CM | POA: Diagnosis not present

## 2021-04-27 DIAGNOSIS — D171 Benign lipomatous neoplasm of skin and subcutaneous tissue of trunk: Secondary | ICD-10-CM | POA: Diagnosis not present

## 2021-04-27 DIAGNOSIS — L814 Other melanin hyperpigmentation: Secondary | ICD-10-CM | POA: Diagnosis not present

## 2021-04-27 DIAGNOSIS — D2261 Melanocytic nevi of right upper limb, including shoulder: Secondary | ICD-10-CM | POA: Diagnosis not present

## 2021-04-27 DIAGNOSIS — D485 Neoplasm of uncertain behavior of skin: Secondary | ICD-10-CM | POA: Diagnosis not present

## 2021-04-27 DIAGNOSIS — D1801 Hemangioma of skin and subcutaneous tissue: Secondary | ICD-10-CM | POA: Diagnosis not present

## 2021-04-27 DIAGNOSIS — Z85828 Personal history of other malignant neoplasm of skin: Secondary | ICD-10-CM | POA: Diagnosis not present

## 2021-04-27 DIAGNOSIS — L821 Other seborrheic keratosis: Secondary | ICD-10-CM | POA: Diagnosis not present

## 2021-04-27 DIAGNOSIS — C44519 Basal cell carcinoma of skin of other part of trunk: Secondary | ICD-10-CM | POA: Diagnosis not present

## 2021-05-02 DIAGNOSIS — M1711 Unilateral primary osteoarthritis, right knee: Secondary | ICD-10-CM | POA: Diagnosis not present

## 2021-05-02 DIAGNOSIS — G8918 Other acute postprocedural pain: Secondary | ICD-10-CM | POA: Diagnosis not present

## 2021-05-05 DIAGNOSIS — M25561 Pain in right knee: Secondary | ICD-10-CM | POA: Diagnosis not present

## 2021-05-08 DIAGNOSIS — M25561 Pain in right knee: Secondary | ICD-10-CM | POA: Diagnosis not present

## 2021-05-10 DIAGNOSIS — M25561 Pain in right knee: Secondary | ICD-10-CM | POA: Diagnosis not present

## 2021-05-12 DIAGNOSIS — M25561 Pain in right knee: Secondary | ICD-10-CM | POA: Diagnosis not present

## 2021-05-15 DIAGNOSIS — M25561 Pain in right knee: Secondary | ICD-10-CM | POA: Diagnosis not present

## 2021-05-17 DIAGNOSIS — M25561 Pain in right knee: Secondary | ICD-10-CM | POA: Diagnosis not present

## 2021-05-19 DIAGNOSIS — M25561 Pain in right knee: Secondary | ICD-10-CM | POA: Diagnosis not present

## 2021-05-22 DIAGNOSIS — M25561 Pain in right knee: Secondary | ICD-10-CM | POA: Diagnosis not present

## 2021-05-24 DIAGNOSIS — M25561 Pain in right knee: Secondary | ICD-10-CM | POA: Diagnosis not present

## 2021-05-30 DIAGNOSIS — M25561 Pain in right knee: Secondary | ICD-10-CM | POA: Diagnosis not present

## 2021-06-01 DIAGNOSIS — M25561 Pain in right knee: Secondary | ICD-10-CM | POA: Diagnosis not present

## 2021-06-06 DIAGNOSIS — Z96651 Presence of right artificial knee joint: Secondary | ICD-10-CM | POA: Diagnosis not present

## 2021-06-06 DIAGNOSIS — Z471 Aftercare following joint replacement surgery: Secondary | ICD-10-CM | POA: Diagnosis not present

## 2021-06-07 DIAGNOSIS — M25561 Pain in right knee: Secondary | ICD-10-CM | POA: Diagnosis not present

## 2021-06-09 DIAGNOSIS — M25561 Pain in right knee: Secondary | ICD-10-CM | POA: Diagnosis not present

## 2021-06-12 DIAGNOSIS — M25561 Pain in right knee: Secondary | ICD-10-CM | POA: Diagnosis not present

## 2021-06-14 DIAGNOSIS — M25561 Pain in right knee: Secondary | ICD-10-CM | POA: Diagnosis not present

## 2021-07-10 DIAGNOSIS — D485 Neoplasm of uncertain behavior of skin: Secondary | ICD-10-CM | POA: Diagnosis not present

## 2021-07-10 DIAGNOSIS — L988 Other specified disorders of the skin and subcutaneous tissue: Secondary | ICD-10-CM | POA: Diagnosis not present

## 2021-07-10 DIAGNOSIS — L720 Epidermal cyst: Secondary | ICD-10-CM | POA: Diagnosis not present

## 2021-07-10 DIAGNOSIS — Z85828 Personal history of other malignant neoplasm of skin: Secondary | ICD-10-CM | POA: Diagnosis not present

## 2021-07-15 DIAGNOSIS — Z23 Encounter for immunization: Secondary | ICD-10-CM | POA: Diagnosis not present

## 2021-08-30 DIAGNOSIS — Z23 Encounter for immunization: Secondary | ICD-10-CM | POA: Diagnosis not present

## 2021-09-27 DIAGNOSIS — Z96651 Presence of right artificial knee joint: Secondary | ICD-10-CM | POA: Diagnosis not present

## 2021-09-27 DIAGNOSIS — I10 Essential (primary) hypertension: Secondary | ICD-10-CM | POA: Diagnosis not present

## 2021-09-27 DIAGNOSIS — E785 Hyperlipidemia, unspecified: Secondary | ICD-10-CM | POA: Diagnosis not present

## 2021-10-04 DIAGNOSIS — R82998 Other abnormal findings in urine: Secondary | ICD-10-CM | POA: Diagnosis not present

## 2021-10-04 DIAGNOSIS — Z1339 Encounter for screening examination for other mental health and behavioral disorders: Secondary | ICD-10-CM | POA: Diagnosis not present

## 2021-10-04 DIAGNOSIS — G629 Polyneuropathy, unspecified: Secondary | ICD-10-CM | POA: Diagnosis not present

## 2021-10-04 DIAGNOSIS — Z1331 Encounter for screening for depression: Secondary | ICD-10-CM | POA: Diagnosis not present

## 2021-10-04 DIAGNOSIS — Z0000 Encounter for general adult medical examination without abnormal findings: Secondary | ICD-10-CM | POA: Diagnosis not present

## 2021-10-04 DIAGNOSIS — I10 Essential (primary) hypertension: Secondary | ICD-10-CM | POA: Diagnosis not present

## 2021-10-04 DIAGNOSIS — E785 Hyperlipidemia, unspecified: Secondary | ICD-10-CM | POA: Diagnosis not present

## 2021-10-04 DIAGNOSIS — E663 Overweight: Secondary | ICD-10-CM | POA: Diagnosis not present

## 2021-10-21 DIAGNOSIS — Z20822 Contact with and (suspected) exposure to covid-19: Secondary | ICD-10-CM | POA: Diagnosis not present

## 2021-10-25 DIAGNOSIS — Z1212 Encounter for screening for malignant neoplasm of rectum: Secondary | ICD-10-CM | POA: Diagnosis not present

## 2022-01-03 DIAGNOSIS — Z20822 Contact with and (suspected) exposure to covid-19: Secondary | ICD-10-CM | POA: Diagnosis not present

## 2022-01-29 DIAGNOSIS — Z20822 Contact with and (suspected) exposure to covid-19: Secondary | ICD-10-CM | POA: Diagnosis not present

## 2022-02-10 DIAGNOSIS — Z20822 Contact with and (suspected) exposure to covid-19: Secondary | ICD-10-CM | POA: Diagnosis not present

## 2022-02-13 DIAGNOSIS — Z20822 Contact with and (suspected) exposure to covid-19: Secondary | ICD-10-CM | POA: Diagnosis not present

## 2022-02-15 DIAGNOSIS — H35372 Puckering of macula, left eye: Secondary | ICD-10-CM | POA: Diagnosis not present

## 2022-02-15 DIAGNOSIS — H2513 Age-related nuclear cataract, bilateral: Secondary | ICD-10-CM | POA: Diagnosis not present

## 2022-02-15 DIAGNOSIS — H524 Presbyopia: Secondary | ICD-10-CM | POA: Diagnosis not present

## 2022-02-15 DIAGNOSIS — H52203 Unspecified astigmatism, bilateral: Secondary | ICD-10-CM | POA: Diagnosis not present

## 2022-02-21 IMAGING — CT CT CARDIAC CORONARY ARTERY CALCIUM SCORE
3 series · 14 of 20 positions shown, 15 images · non-contrast
Comparison: None.
COMPARISON: None.

Addendum:
EXAM:
OVER-READ INTERPRETATION  CT CHEST

The following report is an over-read performed by radiologist Dr.
Akubue Kamshi [REDACTED] on 05/03/2020. This
over-read does not include interpretation of cardiac or coronary
anatomy or pathology. The coronary calcium score interpretation by
the cardiologist is attached.
CLINICAL DATA: Risk stratification
Coronary Calcium Score
TECHNIQUE: The patient was scanned on a Siemens Sensation 16 slice scanner.
Axial non-contrast 3mm slices were carried out through the heart.
The data set was analyzed on a dedicated work station and scored
using the Agatson method.

[Series 2: casc 3.0 bv41 2 bestdiast 70 % · axial · 0.35mm/px · z∈[-233,-149]mm · 4 of 48 slices shown, 5 images]
[im 10/48  vessel]
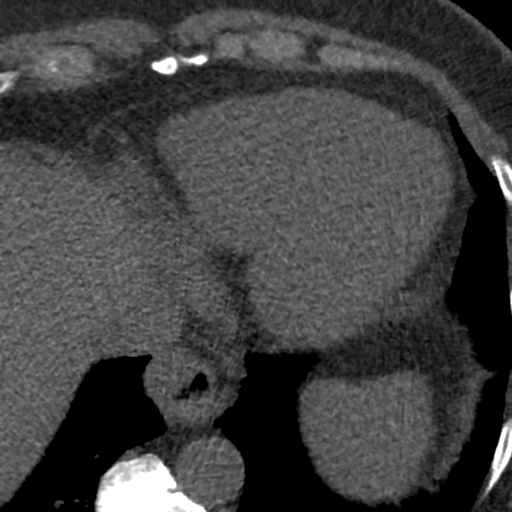
[im 10/48  lung]
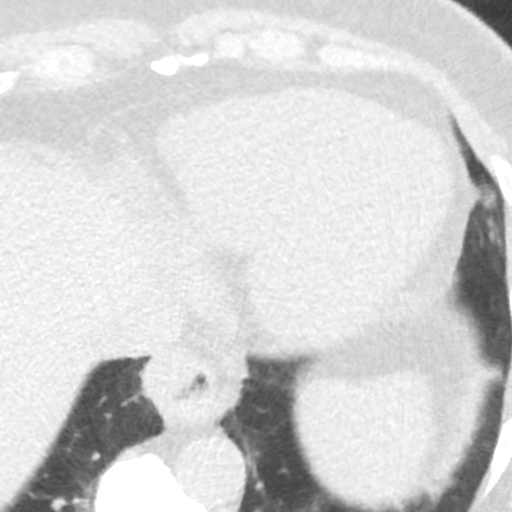
[im 19/48  vessel]
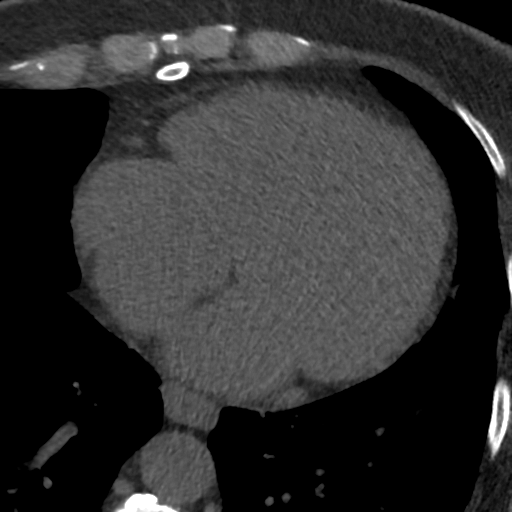
[im 29/48  vessel]
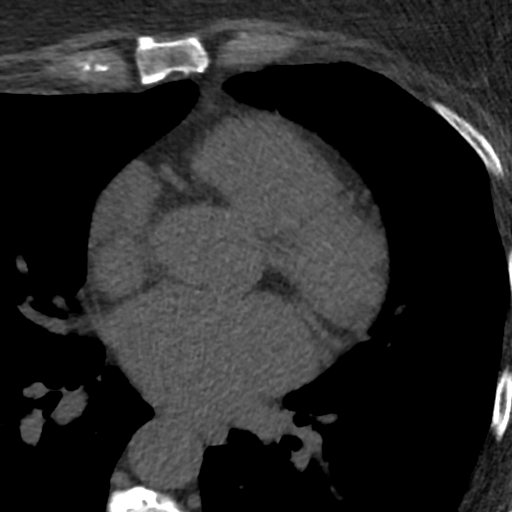
[im 38/48  vessel]
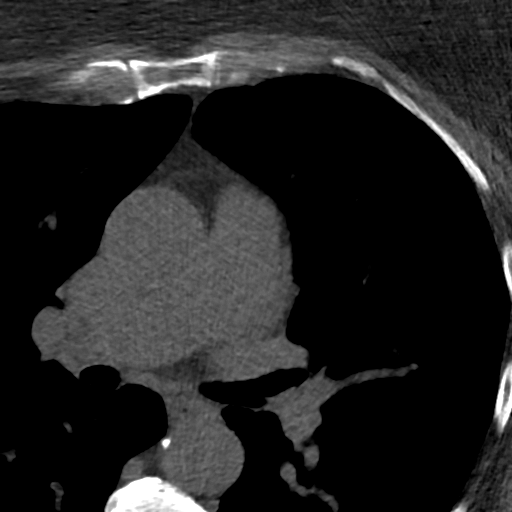

[Series 3: lung 72 % · axial · 0.68mm/px · z∈[-239,-143]mm · 5 of 49 slices shown]
[im 9/49  lung]
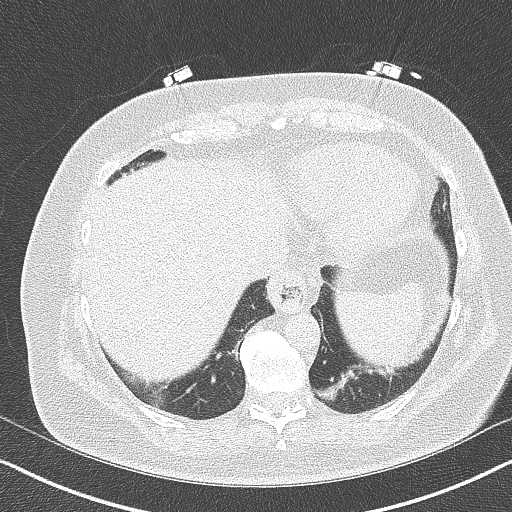
[im 17/49  lung]
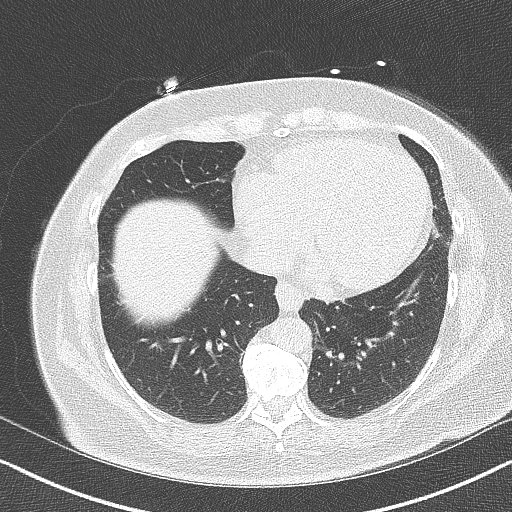
[im 25/49  lung]
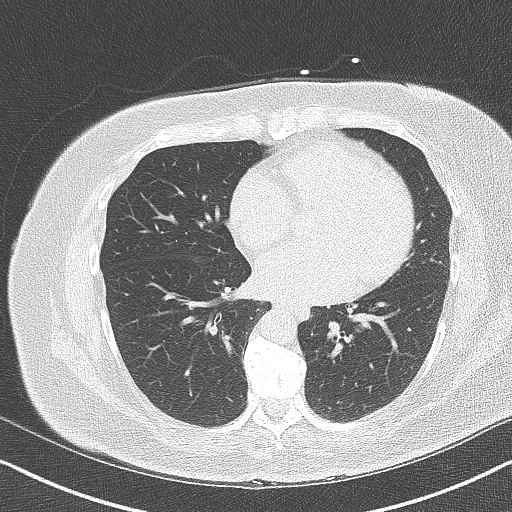
[im 33/49  lung]
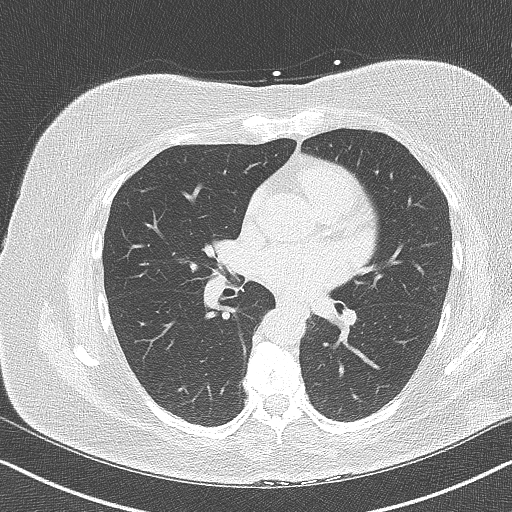
[im 41/49  lung]
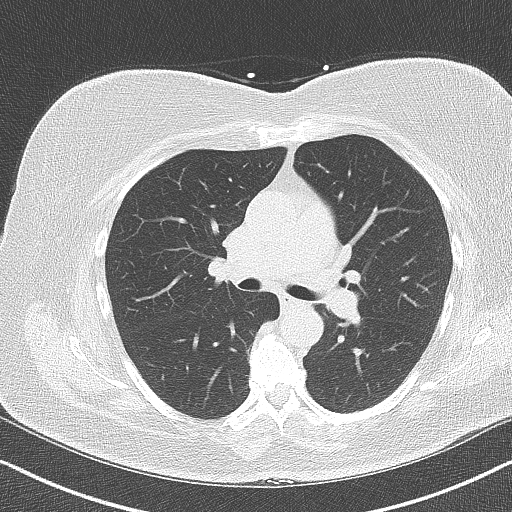

[Series 4: lung st 72 % · axial · 0.68mm/px · z∈[-239,-143]mm · 5 of 49 slices shown]
[im 9/49  lung]
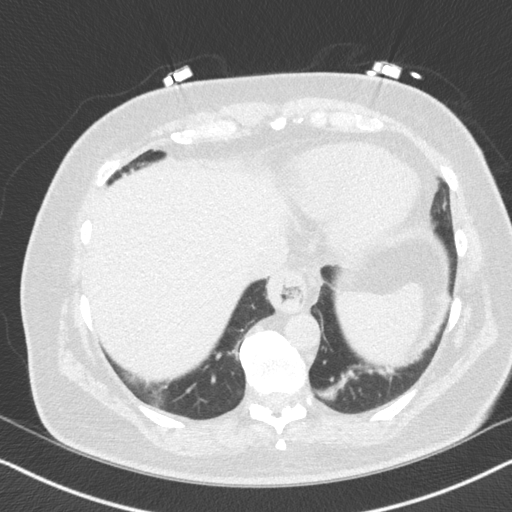
[im 17/49  lung]
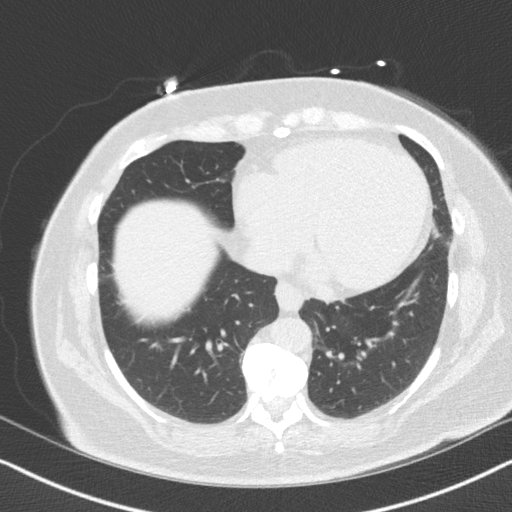
[im 25/49  lung]
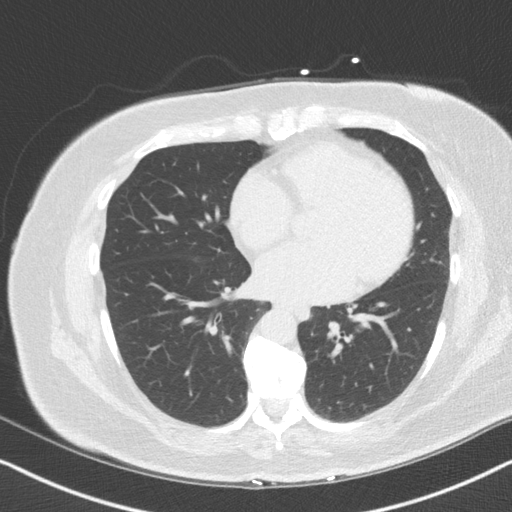
[im 33/49  lung]
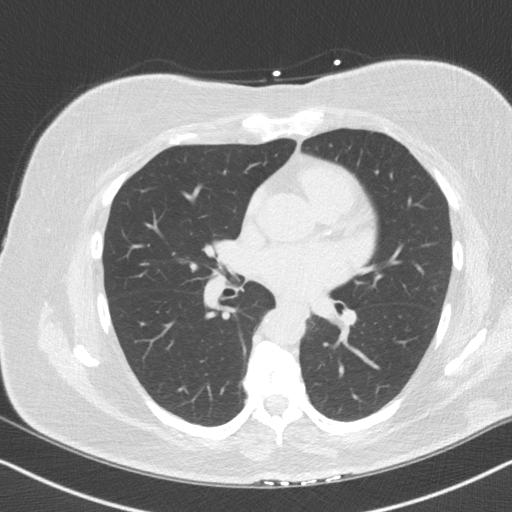
[im 41/49  lung]
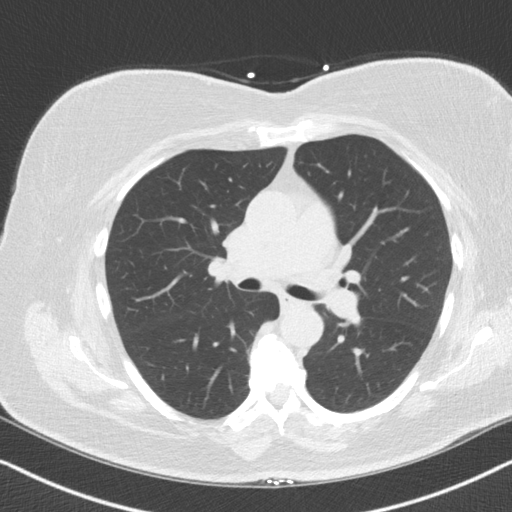

[14 of 20 positions shown; findings below may reference images not displayed]

FINDINGS: Within the visualized portions of the thorax there are no suspicious
appearing pulmonary nodules or masses, there is no acute
consolidative airspace disease, no pleural effusions, no
pneumothorax and no lymphadenopathy. Visualized portions of the
upper abdomen demonstrates a 1.9 cm low-attenuation lesion in
segment 7 of the liver which is incompletely characterized on
today's non-contrast CT examination, but statistically likely to
represent a cyst. There are no aggressive appearing lytic or blastic
lesions noted in the visualized portions of the skeleton.
IMPRESSION: Low-attenuation lesion in segment 7 of the liver, incompletely
characterized on today's non-contrast CT examination, but
statistically likely to represent a cyst.
FINDINGS: Non-cardiac: See separate report from [REDACTED].

Ascending Aorta: Normal caliber

Pericardium: Normal
IMPRESSION: Coronary calcium score of 31. This was 62 percentile for age and sex
matched control, suggesting intermediate risk for future cardiac
events.

Ursul Munch

*** End of Addendum ***
EXAM:
OVER-READ INTERPRETATION  CT CHEST

The following report is an over-read performed by radiologist Dr.
Akubue Kamshi [REDACTED] on 05/03/2020. This
over-read does not include interpretation of cardiac or coronary
anatomy or pathology. The coronary calcium score interpretation by
the cardiologist is attached.
FINDINGS: Within the visualized portions of the thorax there are no suspicious
appearing pulmonary nodules or masses, there is no acute
consolidative airspace disease, no pleural effusions, no
pneumothorax and no lymphadenopathy. Visualized portions of the
upper abdomen demonstrates a 1.9 cm low-attenuation lesion in
segment 7 of the liver which is incompletely characterized on
today's non-contrast CT examination, but statistically likely to
represent a cyst. There are no aggressive appearing lytic or blastic
lesions noted in the visualized portions of the skeleton.
IMPRESSION: Low-attenuation lesion in segment 7 of the liver, incompletely
characterized on today's non-contrast CT examination, but
statistically likely to represent a cyst.

## 2022-03-08 ENCOUNTER — Other Ambulatory Visit: Payer: Self-pay | Admitting: Internal Medicine

## 2022-03-08 DIAGNOSIS — Z1231 Encounter for screening mammogram for malignant neoplasm of breast: Secondary | ICD-10-CM

## 2022-04-09 DIAGNOSIS — M1990 Unspecified osteoarthritis, unspecified site: Secondary | ICD-10-CM | POA: Diagnosis not present

## 2022-04-09 DIAGNOSIS — G629 Polyneuropathy, unspecified: Secondary | ICD-10-CM | POA: Diagnosis not present

## 2022-04-09 DIAGNOSIS — E785 Hyperlipidemia, unspecified: Secondary | ICD-10-CM | POA: Diagnosis not present

## 2022-04-09 DIAGNOSIS — E663 Overweight: Secondary | ICD-10-CM | POA: Diagnosis not present

## 2022-04-09 DIAGNOSIS — I10 Essential (primary) hypertension: Secondary | ICD-10-CM | POA: Diagnosis not present

## 2022-04-24 DIAGNOSIS — H1033 Unspecified acute conjunctivitis, bilateral: Secondary | ICD-10-CM | POA: Diagnosis not present

## 2022-05-01 DIAGNOSIS — L821 Other seborrheic keratosis: Secondary | ICD-10-CM | POA: Diagnosis not present

## 2022-05-01 DIAGNOSIS — L570 Actinic keratosis: Secondary | ICD-10-CM | POA: Diagnosis not present

## 2022-05-01 DIAGNOSIS — Z85828 Personal history of other malignant neoplasm of skin: Secondary | ICD-10-CM | POA: Diagnosis not present

## 2022-05-01 DIAGNOSIS — D1801 Hemangioma of skin and subcutaneous tissue: Secondary | ICD-10-CM | POA: Diagnosis not present

## 2022-05-03 DIAGNOSIS — M25552 Pain in left hip: Secondary | ICD-10-CM | POA: Diagnosis not present

## 2022-05-03 DIAGNOSIS — Z96651 Presence of right artificial knee joint: Secondary | ICD-10-CM | POA: Diagnosis not present

## 2022-05-09 ENCOUNTER — Ambulatory Visit
Admission: RE | Admit: 2022-05-09 | Discharge: 2022-05-09 | Disposition: A | Discharge status: Home / Self Care | Payer: Medicare Other | Source: Ambulatory Visit | Attending: Internal Medicine | Admitting: Internal Medicine

## 2022-05-09 DIAGNOSIS — Z1231 Encounter for screening mammogram for malignant neoplasm of breast: Secondary | ICD-10-CM

## 2022-07-18 DIAGNOSIS — Z23 Encounter for immunization: Secondary | ICD-10-CM | POA: Diagnosis not present

## 2022-08-11 DIAGNOSIS — Z23 Encounter for immunization: Secondary | ICD-10-CM | POA: Diagnosis not present

## 2022-08-14 DIAGNOSIS — H269 Unspecified cataract: Secondary | ICD-10-CM | POA: Diagnosis not present

## 2022-08-14 DIAGNOSIS — Z961 Presence of intraocular lens: Secondary | ICD-10-CM | POA: Diagnosis not present

## 2022-08-14 DIAGNOSIS — H2512 Age-related nuclear cataract, left eye: Secondary | ICD-10-CM | POA: Diagnosis not present

## 2022-08-14 DIAGNOSIS — H52202 Unspecified astigmatism, left eye: Secondary | ICD-10-CM | POA: Diagnosis not present

## 2022-08-14 DIAGNOSIS — H25812 Combined forms of age-related cataract, left eye: Secondary | ICD-10-CM | POA: Diagnosis not present

## 2022-08-14 DIAGNOSIS — H25012 Cortical age-related cataract, left eye: Secondary | ICD-10-CM | POA: Diagnosis not present

## 2022-08-28 DIAGNOSIS — H25011 Cortical age-related cataract, right eye: Secondary | ICD-10-CM | POA: Diagnosis not present

## 2022-08-28 DIAGNOSIS — H25811 Combined forms of age-related cataract, right eye: Secondary | ICD-10-CM | POA: Diagnosis not present

## 2022-08-28 DIAGNOSIS — Z961 Presence of intraocular lens: Secondary | ICD-10-CM | POA: Diagnosis not present

## 2022-08-28 DIAGNOSIS — H269 Unspecified cataract: Secondary | ICD-10-CM | POA: Diagnosis not present

## 2022-08-28 DIAGNOSIS — H2511 Age-related nuclear cataract, right eye: Secondary | ICD-10-CM | POA: Diagnosis not present

## 2022-10-24 DIAGNOSIS — I10 Essential (primary) hypertension: Secondary | ICD-10-CM | POA: Diagnosis not present

## 2022-10-24 DIAGNOSIS — E785 Hyperlipidemia, unspecified: Secondary | ICD-10-CM | POA: Diagnosis not present

## 2022-10-31 DIAGNOSIS — R82998 Other abnormal findings in urine: Secondary | ICD-10-CM | POA: Diagnosis not present

## 2022-10-31 DIAGNOSIS — Z1331 Encounter for screening for depression: Secondary | ICD-10-CM | POA: Diagnosis not present

## 2022-10-31 DIAGNOSIS — E663 Overweight: Secondary | ICD-10-CM | POA: Diagnosis not present

## 2022-10-31 DIAGNOSIS — Z0000 Encounter for general adult medical examination without abnormal findings: Secondary | ICD-10-CM | POA: Diagnosis not present

## 2022-10-31 DIAGNOSIS — Z1339 Encounter for screening examination for other mental health and behavioral disorders: Secondary | ICD-10-CM | POA: Diagnosis not present

## 2022-10-31 DIAGNOSIS — I10 Essential (primary) hypertension: Secondary | ICD-10-CM | POA: Diagnosis not present

## 2022-10-31 DIAGNOSIS — Z23 Encounter for immunization: Secondary | ICD-10-CM | POA: Diagnosis not present

## 2022-10-31 DIAGNOSIS — M1990 Unspecified osteoarthritis, unspecified site: Secondary | ICD-10-CM | POA: Diagnosis not present

## 2022-10-31 DIAGNOSIS — E785 Hyperlipidemia, unspecified: Secondary | ICD-10-CM | POA: Diagnosis not present

## 2023-03-26 ENCOUNTER — Other Ambulatory Visit: Payer: Self-pay | Admitting: Internal Medicine

## 2023-03-26 DIAGNOSIS — Z1231 Encounter for screening mammogram for malignant neoplasm of breast: Secondary | ICD-10-CM

## 2023-05-08 DIAGNOSIS — I10 Essential (primary) hypertension: Secondary | ICD-10-CM | POA: Diagnosis not present

## 2023-05-08 DIAGNOSIS — M5106 Intervertebral disc disorders with myelopathy, lumbar region: Secondary | ICD-10-CM | POA: Diagnosis not present

## 2023-05-08 DIAGNOSIS — E663 Overweight: Secondary | ICD-10-CM | POA: Diagnosis not present

## 2023-05-08 DIAGNOSIS — M5410 Radiculopathy, site unspecified: Secondary | ICD-10-CM | POA: Diagnosis not present

## 2023-05-15 ENCOUNTER — Ambulatory Visit: Payer: Medicare Other

## 2023-05-16 ENCOUNTER — Ambulatory Visit
Admission: RE | Admit: 2023-05-16 | Discharge: 2023-05-16 | Disposition: A | Discharge status: Home / Self Care | Payer: Medicare Other | Source: Ambulatory Visit | Attending: Internal Medicine | Admitting: Internal Medicine

## 2023-05-16 DIAGNOSIS — Z1231 Encounter for screening mammogram for malignant neoplasm of breast: Secondary | ICD-10-CM

## 2023-05-19 DIAGNOSIS — M48061 Spinal stenosis, lumbar region without neurogenic claudication: Secondary | ICD-10-CM | POA: Diagnosis not present

## 2023-06-25 DIAGNOSIS — M4316 Spondylolisthesis, lumbar region: Secondary | ICD-10-CM | POA: Diagnosis not present

## 2023-06-25 DIAGNOSIS — M47816 Spondylosis without myelopathy or radiculopathy, lumbar region: Secondary | ICD-10-CM | POA: Diagnosis not present

## 2023-06-25 DIAGNOSIS — M48061 Spinal stenosis, lumbar region without neurogenic claudication: Secondary | ICD-10-CM | POA: Diagnosis not present

## 2023-07-16 DIAGNOSIS — M5451 Vertebrogenic low back pain: Secondary | ICD-10-CM | POA: Diagnosis not present

## 2023-07-18 DIAGNOSIS — M5451 Vertebrogenic low back pain: Secondary | ICD-10-CM | POA: Diagnosis not present

## 2023-07-20 DIAGNOSIS — Z23 Encounter for immunization: Secondary | ICD-10-CM | POA: Diagnosis not present

## 2023-07-22 DIAGNOSIS — M5451 Vertebrogenic low back pain: Secondary | ICD-10-CM | POA: Diagnosis not present

## 2023-07-24 DIAGNOSIS — M5451 Vertebrogenic low back pain: Secondary | ICD-10-CM | POA: Diagnosis not present

## 2023-08-05 DIAGNOSIS — M5451 Vertebrogenic low back pain: Secondary | ICD-10-CM | POA: Diagnosis not present

## 2023-08-07 DIAGNOSIS — M5451 Vertebrogenic low back pain: Secondary | ICD-10-CM | POA: Diagnosis not present

## 2023-08-12 DIAGNOSIS — M5451 Vertebrogenic low back pain: Secondary | ICD-10-CM | POA: Diagnosis not present

## 2023-08-14 DIAGNOSIS — M5451 Vertebrogenic low back pain: Secondary | ICD-10-CM | POA: Diagnosis not present

## 2023-08-19 DIAGNOSIS — M5451 Vertebrogenic low back pain: Secondary | ICD-10-CM | POA: Diagnosis not present

## 2023-08-20 DIAGNOSIS — Z23 Encounter for immunization: Secondary | ICD-10-CM | POA: Diagnosis not present

## 2023-08-21 DIAGNOSIS — M5451 Vertebrogenic low back pain: Secondary | ICD-10-CM | POA: Diagnosis not present

## 2023-09-03 DIAGNOSIS — M5451 Vertebrogenic low back pain: Secondary | ICD-10-CM | POA: Diagnosis not present

## 2023-09-16 DIAGNOSIS — M47816 Spondylosis without myelopathy or radiculopathy, lumbar region: Secondary | ICD-10-CM | POA: Diagnosis not present

## 2023-09-16 DIAGNOSIS — M48061 Spinal stenosis, lumbar region without neurogenic claudication: Secondary | ICD-10-CM | POA: Diagnosis not present

## 2023-09-19 DIAGNOSIS — M5451 Vertebrogenic low back pain: Secondary | ICD-10-CM | POA: Diagnosis not present

## 2023-10-24 DIAGNOSIS — Z860100 Personal history of colon polyps, unspecified: Secondary | ICD-10-CM | POA: Diagnosis not present

## 2023-10-24 DIAGNOSIS — K5730 Diverticulosis of large intestine without perforation or abscess without bleeding: Secondary | ICD-10-CM | POA: Diagnosis not present

## 2023-10-24 DIAGNOSIS — Z09 Encounter for follow-up examination after completed treatment for conditions other than malignant neoplasm: Secondary | ICD-10-CM | POA: Diagnosis not present

## 2023-10-24 DIAGNOSIS — D123 Benign neoplasm of transverse colon: Secondary | ICD-10-CM | POA: Diagnosis not present

## 2023-10-28 DIAGNOSIS — H18513 Endothelial corneal dystrophy, bilateral: Secondary | ICD-10-CM | POA: Diagnosis not present

## 2023-10-28 DIAGNOSIS — Z961 Presence of intraocular lens: Secondary | ICD-10-CM | POA: Diagnosis not present

## 2023-10-28 DIAGNOSIS — D123 Benign neoplasm of transverse colon: Secondary | ICD-10-CM | POA: Diagnosis not present

## 2023-10-28 DIAGNOSIS — H524 Presbyopia: Secondary | ICD-10-CM | POA: Diagnosis not present

## 2023-10-28 DIAGNOSIS — H35372 Puckering of macula, left eye: Secondary | ICD-10-CM | POA: Diagnosis not present

## 2023-10-30 DIAGNOSIS — I10 Essential (primary) hypertension: Secondary | ICD-10-CM | POA: Diagnosis not present

## 2023-10-30 DIAGNOSIS — Z79899 Other long term (current) drug therapy: Secondary | ICD-10-CM | POA: Diagnosis not present

## 2023-10-30 DIAGNOSIS — E785 Hyperlipidemia, unspecified: Secondary | ICD-10-CM | POA: Diagnosis not present

## 2023-11-06 DIAGNOSIS — I10 Essential (primary) hypertension: Secondary | ICD-10-CM | POA: Diagnosis not present

## 2023-11-06 DIAGNOSIS — Z0000 Encounter for general adult medical examination without abnormal findings: Secondary | ICD-10-CM | POA: Diagnosis not present

## 2023-11-06 DIAGNOSIS — Z1339 Encounter for screening examination for other mental health and behavioral disorders: Secondary | ICD-10-CM | POA: Diagnosis not present

## 2023-11-06 DIAGNOSIS — Z23 Encounter for immunization: Secondary | ICD-10-CM | POA: Diagnosis not present

## 2023-11-06 DIAGNOSIS — E663 Overweight: Secondary | ICD-10-CM | POA: Diagnosis not present

## 2023-11-06 DIAGNOSIS — M5106 Intervertebral disc disorders with myelopathy, lumbar region: Secondary | ICD-10-CM | POA: Diagnosis not present

## 2023-11-06 DIAGNOSIS — M1990 Unspecified osteoarthritis, unspecified site: Secondary | ICD-10-CM | POA: Diagnosis not present

## 2023-11-06 DIAGNOSIS — G629 Polyneuropathy, unspecified: Secondary | ICD-10-CM | POA: Diagnosis not present

## 2023-11-06 DIAGNOSIS — E785 Hyperlipidemia, unspecified: Secondary | ICD-10-CM | POA: Diagnosis not present

## 2023-11-06 DIAGNOSIS — Z1331 Encounter for screening for depression: Secondary | ICD-10-CM | POA: Diagnosis not present

## 2023-11-06 DIAGNOSIS — R82998 Other abnormal findings in urine: Secondary | ICD-10-CM | POA: Diagnosis not present

## 2024-01-20 DIAGNOSIS — Z85828 Personal history of other malignant neoplasm of skin: Secondary | ICD-10-CM | POA: Diagnosis not present

## 2024-01-20 DIAGNOSIS — L821 Other seborrheic keratosis: Secondary | ICD-10-CM | POA: Diagnosis not present

## 2024-01-20 DIAGNOSIS — D225 Melanocytic nevi of trunk: Secondary | ICD-10-CM | POA: Diagnosis not present

## 2024-01-20 DIAGNOSIS — D235 Other benign neoplasm of skin of trunk: Secondary | ICD-10-CM | POA: Diagnosis not present

## 2024-01-20 DIAGNOSIS — L820 Inflamed seborrheic keratosis: Secondary | ICD-10-CM | POA: Diagnosis not present

## 2024-01-20 DIAGNOSIS — L578 Other skin changes due to chronic exposure to nonionizing radiation: Secondary | ICD-10-CM | POA: Diagnosis not present

## 2024-02-04 DIAGNOSIS — H353132 Nonexudative age-related macular degeneration, bilateral, intermediate dry stage: Secondary | ICD-10-CM | POA: Diagnosis not present

## 2024-02-04 DIAGNOSIS — H353221 Exudative age-related macular degeneration, left eye, with active choroidal neovascularization: Secondary | ICD-10-CM | POA: Diagnosis not present

## 2024-02-04 DIAGNOSIS — Z961 Presence of intraocular lens: Secondary | ICD-10-CM | POA: Diagnosis not present

## 2024-02-04 DIAGNOSIS — H02836 Dermatochalasis of left eye, unspecified eyelid: Secondary | ICD-10-CM | POA: Diagnosis not present

## 2024-02-04 DIAGNOSIS — H35722 Serous detachment of retinal pigment epithelium, left eye: Secondary | ICD-10-CM | POA: Diagnosis not present

## 2024-02-04 DIAGNOSIS — H02833 Dermatochalasis of right eye, unspecified eyelid: Secondary | ICD-10-CM | POA: Diagnosis not present

## 2024-03-11 DIAGNOSIS — H353132 Nonexudative age-related macular degeneration, bilateral, intermediate dry stage: Secondary | ICD-10-CM | POA: Diagnosis not present

## 2024-03-11 DIAGNOSIS — H02836 Dermatochalasis of left eye, unspecified eyelid: Secondary | ICD-10-CM | POA: Diagnosis not present

## 2024-03-11 DIAGNOSIS — H353221 Exudative age-related macular degeneration, left eye, with active choroidal neovascularization: Secondary | ICD-10-CM | POA: Diagnosis not present

## 2024-03-11 DIAGNOSIS — H02833 Dermatochalasis of right eye, unspecified eyelid: Secondary | ICD-10-CM | POA: Diagnosis not present

## 2024-03-11 DIAGNOSIS — H35722 Serous detachment of retinal pigment epithelium, left eye: Secondary | ICD-10-CM | POA: Diagnosis not present
